# Patient Record
Sex: Male | Born: 1990 | Race: White | Hispanic: No | Marital: Single | Smoking: Never smoker
Health system: Southern US, Community
[De-identification: ages and names within clinical notes are randomized; demographics above are authoritative.]

## PROBLEM LIST (undated history)

## (undated) DIAGNOSIS — G809 Cerebral palsy, unspecified: Secondary | ICD-10-CM

## (undated) DIAGNOSIS — M3119 Other thrombotic microangiopathy: Secondary | ICD-10-CM

## (undated) DIAGNOSIS — Z8669 Personal history of other diseases of the nervous system and sense organs: Secondary | ICD-10-CM

## (undated) DIAGNOSIS — R197 Diarrhea, unspecified: Secondary | ICD-10-CM

## (undated) DIAGNOSIS — M311 Thrombotic microangiopathy: Secondary | ICD-10-CM

## (undated) HISTORY — PX: NISSEN FUNDOPLICATION: SHX2091

## (undated) HISTORY — DX: Personal history of other diseases of the nervous system and sense organs: Z86.69

## (undated) HISTORY — DX: Thrombotic microangiopathy: M31.1

## (undated) HISTORY — DX: Other thrombotic microangiopathy: M31.19

## (undated) HISTORY — DX: Cerebral palsy, unspecified: G80.9

## (undated) HISTORY — DX: Diarrhea, unspecified: R19.7

---
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
## 2010-01-03 ENCOUNTER — Ambulatory Visit: Payer: Self-pay | Admitting: Pediatrics

## 2010-03-12 ENCOUNTER — Encounter: Payer: Self-pay | Admitting: Pediatrics

## 2010-04-03 ENCOUNTER — Encounter: Payer: Self-pay | Admitting: Pediatrics

## 2011-08-28 ENCOUNTER — Encounter: Payer: Self-pay | Admitting: Pediatrics

## 2011-08-28 ENCOUNTER — Ambulatory Visit (INDEPENDENT_AMBULATORY_CARE_PROVIDER_SITE_OTHER): Payer: Medicaid Other | Admitting: Pediatrics

## 2011-08-28 ENCOUNTER — Encounter: Payer: Self-pay | Admitting: *Deleted

## 2011-08-28 VITALS — BP 103/71 | HR 101 | Temp 97.0°F | Ht <= 58 in | Wt <= 1120 oz

## 2011-08-28 DIAGNOSIS — K5289 Other specified noninfective gastroenteritis and colitis: Secondary | ICD-10-CM

## 2011-08-28 DIAGNOSIS — K529 Noninfective gastroenteritis and colitis, unspecified: Secondary | ICD-10-CM

## 2011-08-28 DIAGNOSIS — R197 Diarrhea, unspecified: Secondary | ICD-10-CM

## 2011-08-28 NOTE — Patient Instructions (Signed)
Continue Pentasa 500 mg three times daily. Will refer to Adult GI at [facility] for further care.

## 2011-08-29 ENCOUNTER — Encounter: Payer: Self-pay | Admitting: Pediatrics

## 2011-08-29 DIAGNOSIS — K529 Noninfective gastroenteritis and colitis, unspecified: Secondary | ICD-10-CM | POA: Insufficient documentation

## 2011-08-29 MED ORDER — MESALAMINE ER 500 MG PO CPCR: 500.0000 mg | ORAL_CAPSULE | Freq: Three times a day (TID) | ORAL | Status: DC

## 2011-08-29 NOTE — Progress Notes (Signed)
Subjective:     Patient ID: [name], male   DOB: 1991/01/08, 21 y.o.   MRN: [id number] BP 103/71  Pulse 101  Temp(Src) 97 F (36.1 C) (Axillary)  Ht 4' 1.5" (1.257 m)  Wt 50 lb (22.68 kg)  BMI 14.35 kg/m2. HPI Almost 21 yo male with unspecified colitis last seen 2 years ago. Weight increased 4 pounds. Continues to do well on Pentasa 1.5 gram daily but breakthrough episode every 10 days with watery BM but no blood. Also getting Omeprazole 20 mg QAM and 1 tablespoon fiber daily. 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 No fever, vomiting, abdomional distention, rashes, dysuria, arthralgia, etc.  Review of Systems  Constitutional: Negative.  Negative for fever, activity change, appetite change and unexpected weight change.  HENT: Negative.   Eyes: Negative.  Negative for visual disturbance.  Respiratory: Negative.  Negative for cough and wheezing.   Cardiovascular: Negative.  Negative for chest pain.  Gastrointestinal: Positive for diarrhea. Negative for nausea, vomiting, abdominal pain, constipation, blood in stool, abdominal distention and rectal pain.  Genitourinary: Negative.  Negative for dysuria and hematuria.  Musculoskeletal: Negative.  Negative for arthralgias.  Skin: Negative.  Negative for rash.  Neurological: Negative.  Negative for headaches.  Hematological: Negative.   Psychiatric/Behavioral: Negative.        Objective:   Physical Exam  Nursing note and vitals reviewed. Constitutional: He appears well-developed and well-nourished. No distress.  HENT:  Head: Normocephalic and atraumatic.  Eyes: Conjunctivae are normal.  Neck: Normal range of motion. No thyromegaly present.  Cardiovascular: Normal rate, regular rhythm and normal heart sounds.   No murmur heard. Pulmonary/Chest: Effort normal and breath sounds normal. He has no wheezes.  Abdominal: Soft. Bowel sounds are normal. He exhibits no distension and no mass. There is no tenderness.    Musculoskeletal: He exhibits no edema.  Lymphadenopathy:    He has no cervical adenopathy.  Skin: Skin is warm and dry. No rash noted.       Assessment:   Unspecified colitis-continued good response to mesalamine but may need to increase dose    Plan:   Discussed transferring care to adult GI due to advanced age (recommended [facility])  Told parents that [name]'s colitis remains poorly defined but is obviously a chronic process and will      require academic adult GI followup in case other problems arise  RTC prn

## 2011-08-29 NOTE — Progress Notes (Signed)
Addended by: Jon Gills on: 08/29/2011 04:05 PM   Modules accepted: Orders

## 2011-12-24 DIAGNOSIS — D693 Immune thrombocytopenic purpura: Secondary | ICD-10-CM | POA: Insufficient documentation

## 2011-12-24 DIAGNOSIS — R569 Unspecified convulsions: Secondary | ICD-10-CM | POA: Insufficient documentation

## 2011-12-24 DIAGNOSIS — G809 Cerebral palsy, unspecified: Secondary | ICD-10-CM | POA: Insufficient documentation

## 2012-12-03 ENCOUNTER — Other Ambulatory Visit: Payer: Self-pay | Admitting: Pediatrics

## 2012-12-03 DIAGNOSIS — R197 Diarrhea, unspecified: Secondary | ICD-10-CM

## 2012-12-03 DIAGNOSIS — K529 Noninfective gastroenteritis and colitis, unspecified: Secondary | ICD-10-CM

## 2012-12-03 MED ORDER — MESALAMINE ER 500 MG PO CPCR
500.0000 mg | ORAL_CAPSULE | Freq: Three times a day (TID) | ORAL | Status: DC
Start: 2012-12-03 — End: 2020-08-17

## 2013-08-07 ENCOUNTER — Other Ambulatory Visit: Payer: Self-pay | Admitting: Pediatrics

## 2013-08-10 ENCOUNTER — Other Ambulatory Visit: Payer: Self-pay | Admitting: Pediatrics

## 2013-08-14 ENCOUNTER — Other Ambulatory Visit: Payer: Self-pay | Admitting: Pediatrics

## 2013-09-04 ENCOUNTER — Other Ambulatory Visit: Payer: Self-pay | Admitting: Pediatrics

## 2013-12-01 ENCOUNTER — Encounter: Payer: Self-pay | Admitting: Pediatrics

## 2014-01-01 ENCOUNTER — Encounter: Payer: Self-pay | Admitting: Pediatrics

## 2015-09-20 ENCOUNTER — Ambulatory Visit: Payer: Self-pay | Admitting: Family Medicine

## 2017-11-05 ENCOUNTER — Ambulatory Visit: Payer: PRIVATE HEALTH INSURANCE

## 2017-11-05 ENCOUNTER — Other Ambulatory Visit: Payer: Self-pay | Admitting: Pediatrics

## 2017-11-05 DIAGNOSIS — R319 Hematuria, unspecified: Secondary | ICD-10-CM

## 2017-11-07 DIAGNOSIS — J45909 Unspecified asthma, uncomplicated: Secondary | ICD-10-CM | POA: Insufficient documentation

## 2017-11-07 DIAGNOSIS — E119 Type 2 diabetes mellitus without complications: Secondary | ICD-10-CM | POA: Insufficient documentation

## 2017-11-08 DIAGNOSIS — N200 Calculus of kidney: Secondary | ICD-10-CM | POA: Insufficient documentation

## 2017-11-11 DIAGNOSIS — H548 Legal blindness, as defined in USA: Secondary | ICD-10-CM | POA: Insufficient documentation

## 2017-11-15 DIAGNOSIS — Z833 Family history of diabetes mellitus: Secondary | ICD-10-CM | POA: Insufficient documentation

## 2017-11-15 DIAGNOSIS — R636 Underweight: Secondary | ICD-10-CM | POA: Insufficient documentation

## 2017-12-31 DIAGNOSIS — R768 Other specified abnormal immunological findings in serum: Secondary | ICD-10-CM | POA: Insufficient documentation

## 2020-05-29 DIAGNOSIS — K900 Celiac disease: Secondary | ICD-10-CM | POA: Insufficient documentation

## 2020-07-19 DIAGNOSIS — G0481 Other encephalitis and encephalomyelitis: Secondary | ICD-10-CM | POA: Insufficient documentation

## 2020-08-12 DIAGNOSIS — J690 Pneumonitis due to inhalation of food and vomit: Secondary | ICD-10-CM | POA: Insufficient documentation

## 2020-08-15 DIAGNOSIS — E43 Unspecified severe protein-calorie malnutrition: Secondary | ICD-10-CM | POA: Insufficient documentation

## 2020-08-16 ENCOUNTER — Telehealth: Payer: Self-pay | Admitting: Primary Care

## 2020-08-16 NOTE — Telephone Encounter (Signed)
Spoke with parents regarding Palliative referral/services and they are requesting a visit as soon as possible.  Patient was discharged home with feeding tube and a stage 2 sacral ulcer.  Patient was discharged from [facility] over the weekend and the discharge planner was unable to find a home health company to see patient.  I told parents that I would reach out to NP to see if she could possibly see patient before the weekend and that I would get back with them

## 2020-08-17 ENCOUNTER — Other Ambulatory Visit: Payer: Self-pay

## 2020-08-17 ENCOUNTER — Telehealth: Payer: Self-pay | Admitting: Primary Care

## 2020-08-17 ENCOUNTER — Other Ambulatory Visit: Payer: PRIVATE HEALTH INSURANCE | Admitting: Primary Care

## 2020-08-17 DIAGNOSIS — E119 Type 2 diabetes mellitus without complications: Secondary | ICD-10-CM

## 2020-08-17 DIAGNOSIS — J690 Pneumonitis due to inhalation of food and vomit: Secondary | ICD-10-CM

## 2020-08-17 DIAGNOSIS — Z515 Encounter for palliative care: Secondary | ICD-10-CM

## 2020-08-17 DIAGNOSIS — L89150 Pressure ulcer of sacral region, unstageable: Secondary | ICD-10-CM | POA: Insufficient documentation

## 2020-08-17 DIAGNOSIS — R636 Underweight: Secondary | ICD-10-CM

## 2020-08-17 DIAGNOSIS — H4720 Unspecified optic atrophy: Secondary | ICD-10-CM | POA: Insufficient documentation

## 2020-08-17 DIAGNOSIS — Q120 Congenital cataract: Secondary | ICD-10-CM | POA: Insufficient documentation

## 2020-08-17 DIAGNOSIS — E43 Unspecified severe protein-calorie malnutrition: Secondary | ICD-10-CM

## 2020-08-17 NOTE — Telephone Encounter (Signed)
Spoke with patient's father to let him know that the Palliative NP could see patient on 08/17/20 @ 10:30 AM and he was in agreement with this.

## 2020-08-17 NOTE — Progress Notes (Addendum)
[facility] Consult Note Telephone: [phone]  Fax: [phone]    Date of encounter: 08/17/20 PATIENT NAME: [name] [address] [place] [address] [phone] (home)  DOB: Mar 07, 1991 MRN: [id number]  PRIMARY CARE PROVIDER:    Derinda Late, MD [address] and Internal Medicine [place] [address] [phone]  REFERRING PROVIDER:   Derinda Late, MD [phone] [address] [facility] and Internal Medicine [place] [address] [phone]  RESPONSIBLE PARTY:   Extended Emergency Contact Information Primary Emergency Contact: [facility] Address: [address]          [place], [place] [address] Johnnette Litter of [place] Phone: [phone] Relation: Father Secondary Emergency Contact: Corinna Capra Address: [address]          [place], [place] [address] Home Phone: [phone] Work Phone: [phone] Relation: None  I met face to face with patient and family in home. Pallitive Care was asked to follow this patient by consultation request of Derinda Late, MD  to help address advance care planning and goals of care. This is the initial visit.   ASSESSMENT AND RECOMMENDATIONS:   1. Advance Care Planning/Goals of Care: Goals include to maximize quality of life and symptom management. Our advance care planning conversation included a discussion about:     The value and importance of advance care planning   Experiences with loved ones who have been seriously ill or have died   Exploration of personal, cultural or spiritual beliefs that might influence medical decisions   Exploration of goals of care in the event of a sudden injury or illness   Identification  of a healthcare agent   Review  of an  advance directive document    I met with parents  and patient in his home. They gave me his history, recent and remote. We  reviewed current medical problems regarding his nutrition, tolerance of tube feedings which is poor, and skin integrity. I asked them if they had talked about their advance plans and they had heard of it but had never discussed. We discussed their son's birth and disabilities and how they have spent their lives caring for him. He's always been able to come back from illnesses but he's now 48 pounds and is not tolerating the tube feedings.   His mother states she feels he is suffering and they don't want that for him but they also want to do everything they can. We discussed curative versus futile care, when it is time to stop treatments and embrace comfort care. Mother was tearful that he was suffering now trying to please them. She did not want this for him. She and her husband will discuss their goals and discuss with his physicians this week. I encouraged them to ask for a candid description of what the goals for any further interventions were and If this was in keeping with their intended goals of care. I outlined the hospice program with patient's mother I.e. services support philosophy and care management. I will f/u with more discussions,  and left some literature about difficult decisions, and the MOST form for review.   I spent 40 minutes providing this consultation,  from 1100 to 1140. More than 50% of the time in this consultation was spent in counseling and care coordination. -------------------------------------------------------------------------------------------------------  2. Symptom Management:   Patient met today with debilitating chronic disease onset in past year. Has CP at baseline  and has seizures from childhood to age 29 until age 12.  Has focal seizures. 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 Glucose was dropping to < 70 in am.  Now is in 400's. Patient has had increasing debility and not tolerateing tube feedings  Has had periods of weakness and somnolence,   2 weeks ago. He went to ED for this. Has mass on L upper lobe, and 7 cm abscess. From pneumonia. Went on iv abx for that.  Constipation: Chronic constipation, so significant the goals is several loose stools a day. This often soils the sacral dressing.   Pressure ulcer: wt is 48 lbs, baseline 70 lbs in 2019, since 2019 has been 60 lbs. Height 54". Has very bony prominent coccyx. I have sent in santyl for daily use and instructed in use. They have ordered foam dressings.  Nutrition: Has tube feedings now, per pump. Cannot eat po now without aspirating. Has been as low as 49 lbs. Had NG tube initially but now has peg.  He does not appear to be tolerating the tube feedings, generating a lot of secretions and coughing.  Secretions have some pink tinge. Mother endorses some pink colored medications but he may also have some erosion. I discussed his intolerance of tube feeds with his mother. I recommend reduction of volume and f/u for intolerance of feeds.  Secretions: suction per [place]. Has had aspiration so now and developed pneumonia. This is chronic risk due to choking/gagging/coughing. They are having to suction him 6 times / hr and he is uncomfortable with secretions most of the time. Recommend not cutting scopolamine patch but using whole or po anticholinergic. However I feel this is mostly from poorly tolerated tube feeds.  3. Follow up Palliative Care Visit: Palliative care will continue to follow for goals of care clarification and symptom management. Gave materials. Return 1-2 weeks or prn.   4. Family /Caregiver/Community Supports: Lives with parents who are caregivers. He is not able to make decisions. Completely dependent.  5. Cognitive / Functional decline: A and O x 1, non verbal. Dependent in all alds, iadls.  CODE STATUS: FULL  PPS: 30% (weak)  HOSPICE ELIGIBILITY/DIAGNOSIS: yes with concordant goals of care. Protein calorie malnutrition, gut malabsorption.  Subjective:  CHIEF  COMPLAINT: gut malabsorption, debility, FTT  HISTORY OF PRESENT ILLNESS:  [name] is a 30 y.o. year old male  with extensive history of CP, auto immune disorders. Today presents with Failure to thrive. Has now been Rx for 4-6 weeks for decline, not able to eat, tube feedings poorly tolerated with increased secretions. Has had x ray confirmed L lobe pneumonia.  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 He has pain and discomfort on moving, sacral decubitus with 100 %  Slough. He appears painful and PAINAD is 9/10.    We are asked to consult around advance care planning and complex medical decision making.    Review and summarization of old Epic records shows or history from other than patient.  Review or lab tests, radiology,  or medicine. Labs, x rays of L lung Review of case with family member. parents Decision for obtaining previous records [facility]   History obtained from review of EMR, discussion with primary team, and  interview with family, caregiver  and/or Mr. Selmer. Records reviewed and summarized above.   CURRENT PROBLEM LIST:  Patient Active Problem List   Diagnosis Date Noted  . 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 Severe protein-calorie malnutrition ([place]) 08/15/2020  . Aspiration pneumonia ([place]) 08/12/2020  . 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 Celiac disease 05/29/2020  . 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 Family history of type 2 diabetes mellitus in mother 11/15/2017  . Underweight 11/15/2017  . Legally blind 11/11/2017  . Nephrolithiasis 11/08/2017  . Asthma 11/07/2017  . Diabetes mellitus, new onset ([place]) 11/07/2017  . Cerebral palsy ([place]) 12/24/2011  . 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 Colitis 08/29/2011  . Diarrhea     PAST MEDICAL HISTORY:  Active Ambulatory Problems    Diagnosis Date Noted  . Diarrhea   . Colitis 08/29/2011  . Aspiration pneumonia ([place])  08/12/2020  . Asthma 11/07/2017  . 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 Celiac disease 05/29/2020  . Cerebral palsy ([place]) 12/24/2011  . 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 Diabetes mellitus, new onset ([place]) 11/07/2017  . Family history of type 2 diabetes mellitus in mother 11/15/2017  . 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 Legally blind 11/11/2017  . Nephrolithiasis 11/08/2017  . 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 Severe protein-calorie malnutrition ([place]) 08/15/2020  . Underweight 11/15/2017   Resolved Ambulatory Problems    Diagnosis Date Noted  . No Resolved Ambulatory Problems   Past Medical History:  Diagnosis Date  . History of seizure disorder   . TTP (thrombotic thrombocytopenic purpura)     SOCIAL HX:  Social History   Tobacco Use  . Smoking status: Never Smoker  . Smokeless tobacco: Never Used  Substance Use Topics  . Alcohol use: Not on file   FAMILY HX:  Family History  Problem Relation Age of Onset  . Migraines Brother       ALLERGIES:  Allergies  Allergen Reactions  . Phenytoin Sodium Extended Rash and Shortness Of Breath  . Wound Dressing Adhesive   . Albuterol Other (See Comments)    Increased heart rate  . Morphine And Related   . Phenobarbital   . Phenytoin Sodium Extended   . Sulfa Antibiotics   . Amoxicillin-Pot Clavulanate Diarrhea and Rash  . Bacitracin-Polymyxin B Rash  . Morphine Other (See Comments) and Rash      PERTINENT MEDICATIONS: Scheduled Meds: Continuous Infusions: PRN Meds:.   Objective: ROS/ parent report   General: NAD EYES: legally blind ENMT: ++dysphagia Pulmonary: ++  cough, denies increased SOB Abdomen: endorses poor appetite, NPO, endorses constipation, endorses incontinence of bowel GU: denies dysuria, endorses incontinence of urine MSK:  endorses ROM limitations, no falls reported Skin: endorses sacral wound Neurological: endorses  weakness, endorses pain, endorses insomnia Psych: Endorses withdrawn mood Heme/lymph/immuno: denies bruises, abnormal bleeding  Physical Exam: Current and past weights: reported 48 lbs (height 54"), BMI is 11.6 Constitutional: 160/96, hr 127, rr 24 po2 93% General: frail appearing, cachectic, painful EYES: anicteric sclera, lids intact, no discharge  ENMT: intact hearing,oral mucous membranes moist, dentition intact CV: S1S2, RRR, no LE edema Pulmonary:CLear on R, decreased air movement In L base, pt vocalizing somewhat, slight increased work of breathing, + cough, room air Abdomen: intake  Per PEG, having to decrease due to poor tolerance, normo-active BS +  4 quadrants, soft and non tender, no ascites GU: deferred MSK: severe sarcopenia, h/o CP, contractures of LE,UE, non ambulatory Skin: unstageable sacral PI, 5 cm x 3 cm with 100% slough in base. Neuro: extreme generalized weakness, severe cognitive impairment Psych: anxious affect, A and O x  1 Hem/lymph/immuno: no widespread bruising   Thank you for the opportunity to participate in the care of Mr. Codner.  The palliative care team will continue to follow. Please call our office at [phone] if we can be of additional assistance.  Jason Coop, NP , DNP, MPH, AGPCNP-BC, [facility]   COVID-19 PATIENT SCREENING TOOL  Person answering questions: _______Kathry Morefield____________   1.  Is the patient or any family member in the home showing any signs or symptoms regarding respiratory infection?                  Person with Symptom  ______________na___________ a. Fever/chills/headache                                                        Yes___ No__X_            b. Shortness of breath                                                            Yes___ No__X_           c. Cough/congestion                                               Yes___  No__X_          d. Muscle/Body aches/pains                                                    Yes___ No__X_         e. Gastrointestinal symptoms (diarrhea,nausea)             Yes___ No__X_         f. Sudden loss of smell or taste      Yes___ No__X_        2. Within the past 10 days, has anyone living in the home had any contact with someone with or under investigation for COVID-19?    Yes___ No__X__   Person __________________

## 2020-08-28 ENCOUNTER — Other Ambulatory Visit: Payer: Self-pay

## 2020-08-28 ENCOUNTER — Other Ambulatory Visit: Payer: No Typology Code available for payment source | Admitting: Primary Care

## 2020-08-28 DIAGNOSIS — E43 Unspecified severe protein-calorie malnutrition: Secondary | ICD-10-CM

## 2020-08-28 DIAGNOSIS — J690 Pneumonitis due to inhalation of food and vomit: Secondary | ICD-10-CM

## 2020-08-28 DIAGNOSIS — Z515 Encounter for palliative care: Secondary | ICD-10-CM

## 2020-08-28 DIAGNOSIS — K900 Celiac disease: Secondary | ICD-10-CM

## 2020-08-28 DIAGNOSIS — R768 Other specified abnormal immunological findings in serum: Secondary | ICD-10-CM

## 2020-08-28 DIAGNOSIS — G808 Other cerebral palsy: Secondary | ICD-10-CM

## 2020-08-28 DIAGNOSIS — E119 Type 2 diabetes mellitus without complications: Secondary | ICD-10-CM

## 2020-08-28 DIAGNOSIS — L89150 Pressure ulcer of sacral region, unstageable: Secondary | ICD-10-CM

## 2020-08-28 DIAGNOSIS — R569 Unspecified convulsions: Secondary | ICD-10-CM

## 2020-08-28 NOTE — Progress Notes (Signed)
[facility] Consult Note Telephone: [phone]  Fax: [phone]    Date of encounter: 08/28/20 PATIENT NAME: [name] [address] [place] [address]   [phone] (home)  DOB: [birth year] MRN: [id number] PRIMARY CARE PROVIDER:    Derinda Late, MD,  [facility] [place] [address] [phone]  REFERRING PROVIDER:   Derinda Late, MD,  [address] [facility] [place]  [phone]  RESPONSIBLE PARTY:    Contact Information    Name Relation Home Work Mobile   [name] Father [phone]     [name]  [phone]  [phone]   [name]  [phone]  [phone]      I met face to face with patient and family in  home. Palliative Care was asked to follow this patient by consultation request of  Joshua Late, MD  to address advance care planning and complex medical decision making. This is the follow up visit.   ASSESSMENT AND RECOMMENDATIONS:   1. Advance Care Planning/Goals of Care: Goals include to maximize quality of life and symptom management. Our advance care planning conversation included a discussion about:     The value and importance of advance care planning   Exploration of personal, cultural or spiritual beliefs that might influence medical decisions   Exploration of goals of care in the event of a sudden injury or illness   Identification of a healthcare agent - Discussed necessity of them getting guardianship  Creation of an  advance directive document .  Decision discussed citing past CPR done when patient was 4. Family states they want full scope of interventions. MOST uploaded.   I spent 16 minutes providing this consultation,  from 1300 to 1316.  More than 50% of the time in this consultation was spent in counseling and care  coordination. ------------------------------------------------------------------------------------------------------------------  2. Symptom Management:   Glucose management: Had mealtime short acting, but has leveled out to be managed with Lantus only. Glucose was spiking to 400's 10 days ago and now is WNL. Parents manage with libra output to smart device.  Dyspnea: Has improved and resolved. Had been coughing and spitting up feeding 10 days ago. Now no effort with breathing, no coughing.  Pressure Ulcer: Area with slough 100% still,  using santyl x 10 days. No real improvement in slough; still copious. Needs to have wound management involved to assess for osteomyelitis and to surgically debrid. Bone may be exposed.  Have made referral to Wound Clinic at [facility] for assessment; pt mother will call for appt. Instructed to continue with daily santyl for now and make initial appt ASAP with wound clinic.  Aspiration: ON 3/11 he aspirated thin and thick liquids on a swallow study. On my last visit he clinically could not handle oral secretions and needed 6-8 suctions/ hour. Today he has no issues with oral secretions, and no coughing. Will have a f/u swallow study.  Nutrition: Has been consulted with nutrition.  Taking 60-70 gm protein daily by tube feeds. Eating po eggs, other soft foods. Has g tube but have gone to bolus ( vs continual) and he is tolerating much better.   3. Follow up Palliative Care Visit: Palliative care will continue to follow for goals of care clarification and symptom management. Return 2 weeks or prn.  4. Family /Caregiver/Community Supports: parents are care givers,  Feeding supplies by [employer].  5. Cognitive / Functional decline: A and o x 1, sitting up, able to play. Dependent in all adls, iadls.  This visit was coded based on medical decision making (MDM).  CODE STATUS: FULL CODE  PPS: 40%  HOSPICE ELIGIBILITY/DIAGNOSIS: with concordant goals of  care  CHIEF COMPLAINT: sacral wound, protein calorie malnutrition  HISTORY OF PRESENT ILLNESS:  [name] is a 30 y.o. year old male  with CP, seizure disorder, protein calorie malnutrition, sacral wound, unstageable,  Type 1 diabetes, autoimmune encephalitis.  Has had pneumonia which clinically has resolved well, able to now tolerate tube feedings, taking 70 gm protein daily. Has gained 1-2 lbs in the past week. Pressure injury on sacrum is 4 cm x 2 cm, with slough in wound bed in context of immobility and malnutrition. This has persisted x 1 year. Has had a bit of breakdown of slough with enzymatic debrider but needs more aggressive debridement. Area is tender on manipulation. Needs wound clinic referral for management.  History obtained from review of EMR, discussion with primary team, and  interview with family, caregiver  and/or [name] Records reviewed and summarized above.  Review lab tests Lab albumin. 2.7 3 weeks ago, now 3.4. Review radiology results Report on 3/11 for swallow study.   CURRENT PROBLEM LIST:  Patient Active Problem List   Diagnosis Date Noted  . 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 Pressure injury of coccygeal region, unstageable ([place]) 08/17/2020  . Severe protein-calorie malnutrition ([place]) 08/15/2020  . Aspiration pneumonia ([place]) 08/12/2020  . 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 Celiac disease 05/29/2020  . 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 Family history of type 2 diabetes mellitus in mother 11/15/2017  . Underweight 11/15/2017  . Legally blind 11/11/2017  . Nephrolithiasis 11/08/2017  . Asthma 11/07/2017  . Diabetes mellitus, new onset ([place]) 11/07/2017  . Cerebral palsy ([place]) 12/24/2011  . 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 Seizures ([place]) 12/24/2011  . Colitis 08/29/2011  . Diarrhea     PAST MEDICAL HISTORY:  Active Ambulatory Problems    Diagnosis Date Noted  .  Diarrhea   . Colitis 08/29/2011  . Aspiration pneumonia ([place]) 08/12/2020  . Asthma 11/07/2017  . 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 Celiac disease 05/29/2020  . Cerebral palsy ([place]) 12/24/2011  . 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 Diabetes mellitus, new onset ([place]) 11/07/2017  . Family history of type 2 diabetes mellitus in mother 11/15/2017  . 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 Legally blind 11/11/2017  . Nephrolithiasis 11/08/2017  . 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 Severe protein-calorie malnutrition ([place]) 08/15/2020  . Underweight 11/15/2017   Resolved Ambulatory Problems    Diagnosis Date Noted  . No Resolved Ambulatory Problems   Past Medical History:  Diagnosis Date  . History of seizure disorder   . TTP (thrombotic thrombocytopenic purpura)    SOCIAL HX:  Social History   Tobacco Use  . Smoking status: Never Smoker  . Smokeless tobacco: Never Used  Substance Use Topics  . Alcohol use: Not on file   FAMILY HX:  Family History  Problem Relation Age of Onset  . Migraines Brother       ALLERGIES:  Allergies  Allergen Reactions  . Phenytoin Sodium Extended Rash and Shortness Of Breath  . Wound Dressing Adhesive   . Albuterol Other (See Comments)    Increased heart rate  . Morphine  And Related   . Phenobarbital   . Phenytoin Sodium Extended   . Sulfa Antibiotics   . Amoxicillin-Pot Clavulanate Diarrhea and Rash  . Bacitracin-Polymyxin B Rash  . Morphine Other (See Comments) and Rash     PERTINENT MEDICATIONS:  Outpatient Encounter Medications as of 08/28/2020  Medication Sig  . baclofen (LIORESAL) 10 MG tablet Take 5 mg by mouth every morning.  . baclofen (LIORESAL) 10 MG tablet Take 10 mg by mouth at bedtime.  . bisacodyl (DULCOLAX) 5 MG EC tablet Take 5 mg by mouth daily.  . budesonide (PULMICORT) 0.5 MG/2ML nebulizer solution Inhale 2 mLs into the lungs daily.  . cefdinir  (OMNICEF) 300 MG capsule Take 300 mg by mouth 2 (two) times daily.  . cloBAZam (ONFI) 10 MG tablet Take by mouth 2 (two) times daily.  Mariane Baumgarten Sodium 150 MG/15ML syrup Take 10 mLs by mouth daily.  . fluticasone (FLONASE) 50 MCG/ACT nasal spray Place 2 sprays into the nose daily.  . fluticasone (FLOVENT HFA) 44 MCG/ACT inhaler Inhale into the lungs 2 (two) times daily.  Marland Kitchen lamoTRIgine (LAMICTAL) 200 MG tablet Place 200 mg into feeding tube 2 (two) times daily.  Marland Kitchen levalbuterol (XOPENEX) 0.63 MG/3ML nebulizer solution Inhale 3 mLs into the lungs every 6 (six) hours as needed for dyspnea.  Marland Kitchen linaclotide (LINZESS) 72 MCG capsule Take 144 mcg by mouth daily.  . mesalamine (PENTASA) 250 MG CR capsule Take 500 mg by mouth in the morning and at bedtime.  . pantoprazole sodium (PROTONIX) 40 mg/20 mL PACK Give 20 mg by tube in the morning and at bedtime.  . polyethylene glycol powder (GLYCOLAX/MIRALAX) 17 GM/SCOOP powder Take 17 g by mouth daily.  . potassium chloride 20 MEQ/15ML (10%) SOLN Place 15 mLs into feeding tube in the morning and at bedtime.  . psyllium (METAMUCIL) 58.6 % packet Take 1 packet by mouth daily. (Patient not taking: Reported on 08/17/2020)  . scopolamine (TRANSDERM-SCOP) 1 MG/3DAYS Place 0.5 patches onto the skin every other day.  . senna (SENOKOT) 8.6 MG tablet Place 1 tablet into feeding tube at bedtime.   No facility-administered encounter medications on file as of 08/28/2020.     ROS per parents  General: NAD ENMT: denies  Overt dysphagia Cardiovascular: denies chest pain, denies DOE Pulmonary: denies cough, denies increased SOB Abdomen: endorses fair appetite, endorses constipation, endorses incontinence of bowel GU: denies dysuria, endorses incontinence of urine MSK:  + weakness,  no falls reported Skin:+ sacral wound Neurological: + occ general and sacral  pain, denies insomnia Psych: Endorses positive mood Heme/lymph/immuno: denies bruises, abnormal  bleeding  Physical Exam: Current and past weights: 49.2 lbs as of today. Baseline is 60 lbs.  Constitutional:  NAD General: frail appearing, cachectic EYES: anicteric sclera, lids intact, no discharge  ENMT: intact hearing, oral mucous membranes moist, dentition intact CV:  no LE edema Pulmonary: no increased work of breathing, no cough, room air Abdomen: intake 100% tube feeds, little po, no ascites GU: deferred MSK: severe  sarcopenia, moves all extremities,non  ambulatory Skin: warm and dry, sacral decubitus, non stageable, 4x2 cm Neuro:   generalized weakness,  Severe cognitive impairment Psych: non-anxious affect, A and O x 1 Hem/lymph/immuno: no widespread bruising   Thank you for the opportunity to participate in the care of [name]  The palliative care team will continue to follow. Please call our office at [phone] if we can be of additional assistance.   Jason Coop, NP , DNP, MPH,  AGPCNP-BC, ACHPN   COVID-19 PATIENT SCREENING TOOL Asked and negative response unless otherwise noted:   Have you had symptoms of covid, tested positive or been in contact with someone with symptoms/positive test in the past 5-10 days?

## 2020-08-30 ENCOUNTER — Other Ambulatory Visit: Payer: Self-pay

## 2020-08-30 ENCOUNTER — Other Ambulatory Visit
Admission: RE | Admit: 2020-08-30 | Discharge: 2020-08-30 | Disposition: A | Discharge status: Home / Self Care | Payer: Medicaid Other | Source: Ambulatory Visit | Attending: Internal Medicine | Admitting: Internal Medicine

## 2020-08-30 ENCOUNTER — Encounter: Payer: PRIVATE HEALTH INSURANCE | Attending: Internal Medicine | Admitting: Internal Medicine

## 2020-08-30 DIAGNOSIS — E43 Unspecified severe protein-calorie malnutrition: Secondary | ICD-10-CM | POA: Diagnosis not present

## 2020-08-30 DIAGNOSIS — Z885 Allergy status to narcotic agent status: Secondary | ICD-10-CM | POA: Diagnosis not present

## 2020-08-30 DIAGNOSIS — E1151 Type 2 diabetes mellitus with diabetic peripheral angiopathy without gangrene: Secondary | ICD-10-CM | POA: Insufficient documentation

## 2020-08-30 DIAGNOSIS — G809 Cerebral palsy, unspecified: Secondary | ICD-10-CM | POA: Diagnosis not present

## 2020-08-30 DIAGNOSIS — L89159 Pressure ulcer of sacral region, unspecified stage: Secondary | ICD-10-CM | POA: Diagnosis not present

## 2020-08-30 DIAGNOSIS — Z931 Gastrostomy status: Secondary | ICD-10-CM | POA: Insufficient documentation

## 2020-08-30 DIAGNOSIS — G0481 Other encephalitis and encephalomyelitis: Secondary | ICD-10-CM | POA: Diagnosis not present

## 2020-08-30 DIAGNOSIS — Z881 Allergy status to other antibiotic agents status: Secondary | ICD-10-CM | POA: Insufficient documentation

## 2020-08-30 DIAGNOSIS — Z882 Allergy status to sulfonamides status: Secondary | ICD-10-CM | POA: Insufficient documentation

## 2020-08-30 DIAGNOSIS — L089 Local infection of the skin and subcutaneous tissue, unspecified: Secondary | ICD-10-CM | POA: Diagnosis present

## 2020-08-30 DIAGNOSIS — L89154 Pressure ulcer of sacral region, stage 4: Secondary | ICD-10-CM | POA: Diagnosis present

## 2020-08-30 DIAGNOSIS — Z888 Allergy status to other drugs, medicaments and biological substances status: Secondary | ICD-10-CM | POA: Insufficient documentation

## 2020-08-30 DIAGNOSIS — Z880 Allergy status to penicillin: Secondary | ICD-10-CM | POA: Insufficient documentation

## 2020-09-01 ENCOUNTER — Other Ambulatory Visit: Payer: Self-pay | Admitting: Internal Medicine

## 2020-09-01 ENCOUNTER — Ambulatory Visit
Admission: RE | Admit: 2020-09-01 | Discharge: 2020-09-01 | Disposition: A | Discharge status: Home / Self Care | Payer: PRIVATE HEALTH INSURANCE | Attending: Internal Medicine | Admitting: Internal Medicine

## 2020-09-01 ENCOUNTER — Ambulatory Visit
Admission: RE | Admit: 2020-09-01 | Discharge: 2020-09-01 | Disposition: A | Discharge status: Home / Self Care | Payer: PRIVATE HEALTH INSURANCE | Source: Ambulatory Visit | Attending: Internal Medicine | Admitting: Internal Medicine

## 2020-09-01 DIAGNOSIS — L089 Local infection of the skin and subcutaneous tissue, unspecified: Secondary | ICD-10-CM | POA: Insufficient documentation

## 2020-09-01 DIAGNOSIS — T148XXA Other injury of unspecified body region, initial encounter: Secondary | ICD-10-CM | POA: Diagnosis present

## 2020-09-03 LAB — AEROBIC CULTURE W GRAM STAIN (SUPERFICIAL SPECIMEN)

## 2020-09-06 ENCOUNTER — Encounter: Payer: PRIVATE HEALTH INSURANCE | Attending: Internal Medicine | Admitting: Internal Medicine

## 2020-09-06 ENCOUNTER — Other Ambulatory Visit: Payer: Self-pay

## 2020-09-06 DIAGNOSIS — K5190 Ulcerative colitis, unspecified, without complications: Secondary | ICD-10-CM | POA: Insufficient documentation

## 2020-09-06 DIAGNOSIS — R569 Unspecified convulsions: Secondary | ICD-10-CM | POA: Diagnosis not present

## 2020-09-06 DIAGNOSIS — L89154 Pressure ulcer of sacral region, stage 4: Secondary | ICD-10-CM | POA: Diagnosis not present

## 2020-09-06 DIAGNOSIS — E11622 Type 2 diabetes mellitus with other skin ulcer: Secondary | ICD-10-CM | POA: Diagnosis present

## 2020-09-06 DIAGNOSIS — G809 Cerebral palsy, unspecified: Secondary | ICD-10-CM | POA: Insufficient documentation

## 2020-09-06 DIAGNOSIS — E43 Unspecified severe protein-calorie malnutrition: Secondary | ICD-10-CM | POA: Diagnosis not present

## 2020-09-07 NOTE — Progress Notes (Signed)
Joshua, [name] ([id number]) Visit Report for 09/06/2020 Debridement Details Patient Name: [name], [name] Date of Service: 09/06/2020 1:45 PM Medical Record Number: [id number] Patient Account Number: [id number] Date of Birth/Sex: 04/07/1991 (29 y.o. M) Treating RN: Cornell Barman Primary Care Provider: Derinda Late Other Clinician: Jeanine Luz Referring Provider: Derinda Late Treating Provider/Extender: Tito Dine in Treatment: 1 Debridement Performed for Wound #1 Sacrum Assessment: Performed By: Physician Ricard Dillon, MD Debridement Type: Debridement Level of Consciousness (Pre- Responds to Painful Stimuli procedure): Pre-procedure Verification/Time Out Yes - 14:36 Taken: Total Area Debrided (L x W): 0.6 (cm) x 0.6 (cm) = 0.36 (cm) Tissue and other material Viable, Non-Viable, Slough, Subcutaneous, Slough debrided: Level: Skin/Subcutaneous Tissue Debridement Description: Excisional Instrument: Curette Bleeding: Moderate Hemostasis Achieved: Pressure Response to Treatment: Procedure was tolerated well Level of Consciousness (Post- Awake and Alert procedure): Post Debridement Measurements of Total Wound Length: (cm) 0.6 Stage: Unstageable/Unclassified Width: (cm) 0.6 Depth: (cm) 0.7 Volume: (cm) 0.198 Character of Wound/Ulcer Post Debridement: Stable Post Procedure Diagnosis Same as Pre-procedure Electronic Signature(s) Signed: 09/06/2020 5:25:07 PM By: Gretta Cool, BSN, RN, CWS, Kim RN, BSN Signed: 09/07/2020 7:59:07 AM By: Linton Ham MD Entered By: Linton Ham on 09/06/2020 15:57:57 Transue, [name] ([id number]) -------------------------------------------------------------------------------- HPI Details Patient Name: [name] Date of Service: 09/06/2020 1:45 PM Medical Record Number: [id number] Patient Account Number: [id number] Date of Birth/Sex: May 01, 1991 (29 y.o. M) Treating RN: Cornell Barman Primary Care  Provider: Derinda Late Other Clinician: Jeanine Luz Referring Provider: Derinda Late Treating Provider/Extender: Tito Dine in Treatment: 1 History of Present Illness HPI Description: ADMISSION 08/30/2020 This is a difficult case of a unfortunate 30 year old man with cerebral palsy and seizures. 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 This caused quite a deterioration recently in his function he required placement of a PEG tube for feeding, he developed a significant left upper lobe aspiration driven lung met lung abscess for which she is taken Augmentin. He is nonambulatory. Nevertheless his parents were here today feel that he had ability to communicate and a good functional level up until very recently. He was hospitalized at the beginning of March for the pneumonia. During this time he was put in one of the hospital standard pressure prevention mattresses although this was apparently meant for an adult resulting in him sliding down and it and he developed a pressure sore on his lower sacrum. 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 They have been using Santyl. Apparently the nurse practitioner for palliative care who comes to see him in the home prescribed this Past medical history includes cerebral palsy with seizures, autoimmune encephalitis. He is a diabetic most recently listed in the [facility] records as type I based on low protein C levels and an anti-GAD antibody however his parents state that this has recently been called into question however I did not delve into this further. He apparently has some form of ulcerative colitis, history of candidemia,  nephrolithiasis, he is tube feed dependent although they still give him something orally. Left upper lobe lung abscess followed by ID at [facility] currently on Augmentin 4/6 culture I did last week showed staph lugdunensis.  X-ray did not show osteomyelitis was a poor quality film they recommended a CT scan. They have been using Santyl-based dressings. Also note that they changed the antibiotics for the lung abscess to Levaquin and Flagyl from Augmentin. The Levaquin will indeed cover the coag negative staph we cultured. He does have exposed bone however I do not feel pressured to do a CT scan at this point. It would be possible to biopsy the bone should that become necessary Electronic Signature(s) Signed: 09/07/2020 7:59:07 AM By: Linton Ham MD Entered By: Linton Ham on 09/06/2020 15:59:13 [name], [name] ([id number]) -------------------------------------------------------------------------------- Physical Exam Details Patient Name: [name] Date of Service: 09/06/2020 1:45 PM Medical Record Number: [id number] Patient Account Number: [id number] Date of Birth/Sex: [birth year] (29 y.o. M) Treating RN: Cornell Barman Primary Care Provider: Derinda Late Other Clinician: Jeanine Luz Referring Provider: Derinda Late Treating Provider/Extender: Tito Dine in Treatment: 1 Constitutional Sitting or standing Blood Pressure is within target range for patient.. Pulse regular and within target range for patient.[name] regular, non- labored and within target range.. Temperature is normal and within the target range for the patient.[name] appears in no distress. Notes Wound exam; penetrating lower sacrum/coccyx wound. There is exposed bone in the middle of this. A lot of liquefied necrotic debris which I removed with a #5 curette. Underneath this we have healthier looking granulation tissue surrounding the open area of exposed bone. Hemostasis with direct pressure  there was no evidence of surrounding infection Electronic Signature(s) Signed: 09/07/2020 7:59:07 AM By: Linton Ham MD Entered By: Linton Ham on 09/06/2020 16:00:18 [name], [name] ([id number]) -------------------------------------------------------------------------------- Physician Orders Details Patient Name: [name] Date of Service: 09/06/2020 1:45 PM Medical Record Number: [id number] Patient Account Number: [id number] Date of Birth/Sex: [birth year] (29 y.o. M) Treating RN: Cornell Barman Primary Care Provider: Derinda Late Other Clinician: Jeanine Luz Referring Provider: Derinda Late Treating Provider/Extender: Tito Dine in Treatment: 1 Verbal / Phone Orders: No Diagnosis Coding Follow-up Appointments Wound #1 Sacrum o Return Appointment in 1 week. Bathing/ Shower/ Hygiene Wound #1 Sacrum o Clean wound with Normal Saline or wound cleanser. Off-Loading o Turn and reposition every 2 hours o Other: - Keep all pressure of of wounded area Wound Treatment Wound #1 - Sacrum Cleanser: Normal Saline 1 x Per Day/30 Days Discharge Instructions: Wash your hands with soap and water. Remove old dressing, discard into plastic bag and place into trash. Cleanse the wound with Normal Saline prior to applying a clean dressing using gauze sponges, not tissues or cotton balls. Do not scrub or use excessive force. Pat dry using gauze sponges, not tissue or cotton balls. Topical: Santyl Collagenase Ointment, 30 (gm), tube 1 x Per Day/30 Days Discharge Instructions: apply nickel thick to wound bed only Secondary Dressing: Mepilex Border Flex, 4x4 (in/in) 1 x Per Day/30 Days Discharge Instructions: Apply to wound as directed. Do not cut. Electronic Signature(s) Signed: 09/06/2020 5:25:07 PM By: Gretta Cool, BSN, RN, CWS, Kim RN, BSN Signed: 09/07/2020 7:59:07 AM By: Linton Ham MD Entered By: Gretta Cool, BSN, RN, CWS, Kim on 09/06/2020 14:38:12 [name],  [name] ([id number]) -------------------------------------------------------------------------------- Problem List Details Patient Name: [name] Date of Service: 09/06/2020 1:45 PM Medical Record Number: [id number] Patient Account Number: [id number] Date of Birth/Sex: 1990/07/15 (29 y.o. M) Treating RN: Cornell Barman Primary Care Provider: Derinda Late Other Clinician: Jeanine Luz Referring Provider: Derinda Late Treating Provider/Extender: Tito Dine in Treatment: 1 Active Problems ICD-10 Encounter Code Description Active Date MDM  Diagnosis L89.154 Pressure ulcer of sacral region, stage 4 08/30/2020 No Yes E43 Unspecified severe protein-calorie malnutrition 08/30/2020 No Yes G80.9 Cerebral palsy, unspecified 08/30/2020 No Yes Inactive Problems Resolved Problems Electronic Signature(s) Signed: 09/07/2020 7:59:07 AM By: Linton Ham MD Entered By: Linton Ham on 09/06/2020 15:57:39 [name], [name] ([id number]) -------------------------------------------------------------------------------- Progress Note Details Patient Name: [name] Date of Service: 09/06/2020 1:45 PM Medical Record Number: [id number] Patient Account Number: [id number] Date of Birth/Sex: 01-09-1991 (29 y.o. M) Treating RN: Cornell Barman Primary Care Provider: Derinda Late Other Clinician: Jeanine Luz Referring Provider: Derinda Late Treating Provider/Extender: Tito Dine in Treatment: 1 Subjective History of Present Illness (HPI) ADMISSION 08/30/2020 This is a difficult case of a unfortunate 30 year old man with cerebral palsy and seizures. 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 This caused quite a deterioration recently in his function he required placement of a PEG tube for  feeding, he developed a significant left upper lobe aspiration driven lung met lung abscess for which she is taken Augmentin. He is nonambulatory. Nevertheless his parents were here today feel that he had ability to communicate and a good functional level up until very recently. He was hospitalized at the beginning of March for the pneumonia. During this time he was put in one of the hospital standard pressure prevention mattresses although this was apparently meant for an adult resulting in him sliding down and it and he developed a pressure sore on his lower sacrum. 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 They have been using Santyl. Apparently the nurse practitioner for palliative care who comes to see him in the home prescribed this Past medical history includes cerebral palsy with seizures, autoimmune encephalitis. He is a diabetic most recently listed in the [facility] records as type I based on low protein C levels and an anti-GAD antibody however his parents state that this has recently been called into question however I did not delve into this further. He apparently has some form of ulcerative colitis, history of candidemia, nephrolithiasis, he is tube feed dependent although they still give him something orally. Left upper lobe lung abscess followed by ID at [facility] currently on Augmentin 4/6 culture I did last week showed staph lugdunensis. X-ray did not show osteomyelitis was a poor quality film they recommended a CT scan. They have been using Santyl-based dressings. Also note that they changed the antibiotics for the lung abscess to Levaquin and Flagyl from Augmentin. The Levaquin will indeed cover the coag negative staph we cultured. He does have exposed bone however I do not feel pressured to do a CT scan at this point. It would be possible to biopsy  the bone should that become necessary Objective Constitutional Sitting or standing Blood Pressure is within target range for patient.. Pulse regular and within target range for patient.[name] regular, non- labored and within target range.. Temperature is normal and within the target range for the patient.[name] appears in no distress. Vitals Time Taken: 1:45 PM, Height: 54 in, Weight: 50 lbs, BMI: 12.1, Pulse: 95 bpm, Respiratory Rate: 16 breaths/min, Blood Pressure: 109/78 mmHg. General Notes: Wound exam; penetrating lower sacrum/coccyx wound. There is exposed  bone in the middle of this. A lot of liquefied necrotic debris which I removed with a #5 curette. Underneath this we have healthier looking granulation tissue surrounding the open area of exposed bone. Hemostasis with direct pressure there was no evidence of surrounding infection Integumentary (Hair, Skin) Wound #1 status is Open. Original cause of wound was Gradually Appeared. The date acquired was: 08/04/2020. The wound has been in treatment 1 weeks. The wound is located on the Sacrum. The wound measures 0.6cm length x 0.6cm width x 0.7cm depth; 0.283cm^2 area and 0.198cm^3 volume. There is Fat Layer (Subcutaneous Tissue) exposed. There is no tunneling or undermining noted. There is a medium amount of serous drainage noted. There is small (1-33%) pink granulation within the wound bed. There is a large (67-100%) amount of necrotic tissue within the wound bed including Adherent Slough. Assessment Active Problems ICD-10 [name], [name] ([id number]) Pressure ulcer of sacral region, stage 4 Unspecified severe protein-calorie malnutrition Cerebral palsy, unspecified Procedures Wound #1 Pre-procedure diagnosis of Wound #1 is a Pressure Ulcer located on the Sacrum . There was a Excisional Skin/Subcutaneous Tissue Debridement with a total area of 0.36 sq cm performed by Ricard Dillon, MD. With the following instrument(s): Curette  to remove Viable and Non- Viable tissue/material. Material removed includes Subcutaneous Tissue and Slough and. No specimens were taken. A time out was conducted at 14:36, prior to the start of the procedure. A Moderate amount of bleeding was controlled with Pressure. The procedure was tolerated well. Post Debridement Measurements: 0.6cm length x 0.6cm width x 0.7cm depth; 0.198cm^3 volume. Post debridement Stage noted as Unstageable/Unclassified. Character of Wound/Ulcer Post Debridement is stable. Post procedure Diagnosis Wound #1: Same as Pre-Procedure Plan Follow-up Appointments: Wound #1 Sacrum: Return Appointment in 1 week. Bathing/ Shower/ Hygiene: Wound #1 Sacrum: Clean wound with Normal Saline or wound cleanser. Off-Loading: Turn and reposition every 2 hours Other: - Keep all pressure of of wounded area WOUND #1: - Sacrum Wound Laterality: Cleanser: Normal Saline 1 x Per Day/30 Days Discharge Instructions: Wash your hands with soap and water. Remove old dressing, discard into plastic bag and place into trash. Cleanse the wound with Normal Saline prior to applying a clean dressing using gauze sponges, not tissues or cotton balls. Do not scrub or use excessive force. Pat dry using gauze sponges, not tissue or cotton balls. Topical: Santyl Collagenase Ointment, 30 (gm), tube 1 x Per Day/30 Days Discharge Instructions: apply nickel thick to wound bed only Secondary Dressing: Mepilex Border Flex, 4x4 (in/in) 1 x Per Day/30 Days Discharge Instructions: Apply to wound as directed. Do not cut. 1. I am going to continue with the Santyl for another week 2. Consider collagen-based dressings after that to see if we can stimulate some granulation 3. I had some thoughts about an advanced treatment option although he is a very odd insurance. We will have to see how that goes 4. The coag negative staph I cultured should be covered by the Levaquin previously and recently prescribed for his left  upper lobe lung abscess/pneumonia. Electronic Signature(s) Signed: 09/07/2020 7:59:07 AM By: Linton Ham MD Entered By: Linton Ham on 09/06/2020 16:01:03 [name], [name] ([id number]) -------------------------------------------------------------------------------- SuperBill Details Patient Name: [name] Date of Service: 09/06/2020 Medical Record Number: [id number] Patient Account Number: [id number] Date of Birth/Sex: [birth year] (29 y.o. M) Treating RN: Cornell Barman Primary Care Provider: Derinda Late Other Clinician: Jeanine Luz Referring Provider: Derinda Late Treating Provider/Extender: Tito Dine in Treatment: 1 Diagnosis Coding ICD-10  Codes Code Description L89.154 Pressure ulcer of sacral region, stage 4 E43 Unspecified severe protein-calorie malnutrition G80.9 Cerebral palsy, unspecified Facility Procedures CPT4 Code: [id number] Description: 45733 - DEB SUBQ TISSUE 20 SQ CM/< Modifier: Quantity: 1 CPT4 Code: Description: ICD-10 Diagnosis Description L89.154 Pressure ulcer of sacral region, stage 4 Modifier: Quantity: Physician Procedures CPT4 Code: [id number] Description: 11042 - WC PHYS SUBQ TISS 20 SQ CM Modifier: Quantity: 1 CPT4 Code: Description: ICD-10 Diagnosis Description L89.154 Pressure ulcer of sacral region, stage 4 Modifier: Quantity: Electronic Signature(s) Signed: 09/07/2020 7:59:07 AM By: Linton Ham MD Entered By: Linton Ham on 09/06/2020 16:01:24

## 2020-09-07 NOTE — Progress Notes (Signed)
[name], [name] ([id number]) Visit Report for 09/06/2020 Arrival Information Details Patient Name: [name], [name] Date of Service: 09/06/2020 1:45 PM Medical Record Number: [id number] Patient Account Number: [id number] Date of Birth/Sex: May 07, 1991 (29 y.o. M) Treating RN: [name] Primary Care [name]: [name] Other Clinician: Lolita [name] Referring Yevette Knust: [name] Treating [name]/Extender: [name] in Treatment: 1 Visit Information History Since Last Visit Added or deleted any medications: No Patient Arrived: Wheel Chair Had a fall or experienced change in No Arrival Time: 13:46 activities of daily living that may affect Accompanied By: parents risk of falls: Transfer Assistance: Manual Hospitalized since last visit: No Patient Identification Verified: Yes Pain Present Now: Unable to Respond Secondary Verification Process Completed: Yes Patient Requires Transmission-Based Precautions: No Patient Has Alerts: No Electronic Signature(s) Signed: 09/06/2020 4:53:25 PM By: [name] Entered By: [name] on 09/06/2020 13:46:51 Reckner, [name] ([id number]) -------------------------------------------------------------------------------- Encounter Discharge Information Details Patient Name: [name] Date of Service: 09/06/2020 1:45 PM Medical Record Number: [id number] Patient Account Number: [id number] Date of Birth/Sex: May 16, 1991 (29 y.o. M) Treating RN: [name] Primary Care [name]: [name] Other Clinician: Lolita [name] Referring [name]: [name] Treating [name]/Extender: [name] in Treatment: 1 Encounter Discharge Information Items Post Procedure Vitals Discharge Condition: Stable Pulse (bpm): 95 Ambulatory Status: Ambulatory Respiratory Rate (breaths/min): 14 Discharge Destination: Home Blood Pressure (mmHg): 109/78 Transportation: Private Auto Unable to  obtain vitals Reason: . Accompanied By: parents Schedule Follow-up Appointment: Yes Clinical Summary of Care: Electronic Signature(s) Signed: 09/06/2020 5:25:07 PM By: [name], BSN, RN, CWS, Kim RN, BSN Entered By: [name], BSN, RN, CWS, Kim on 09/06/2020 14:44:16 [name], [name] ([id number]) -------------------------------------------------------------------------------- Lower Extremity Assessment Details Patient Name: [name] Date of Service: 09/06/2020 1:45 PM Medical Record Number: [id number] Patient Account Number: [id number] Date of Birth/Sex: Oct 28, 1990 (29 y.o. M) Treating RN: [name] Primary Care Julane Crock: [name] Other Clinician: Lolita [name] Referring Camora Tremain: [name] Treating [name]/Extender: [name] in Treatment: 1 Electronic Signature(s) Signed: 09/06/2020 4:53:25 PM By: [name] Signed: 09/06/2020 5:25:07 PM By: [name] BSN, RN, CWS, Kim RN, BSN Entered By: [name] on 09/06/2020 13:48:35 [name], [name] ([id number]) -------------------------------------------------------------------------------- Multi Wound Chart Details Patient Name: [name] Date of Service: 09/06/2020 1:45 PM Medical Record Number: [id number] Patient Account Number: [id number] Date of Birth/Sex: Dec 20, 1990 (29 y.o. M) Treating RN: [name] Primary Care Ovie Eastep: [name] Other Clinician: Lolita [name] Referring Bexley Mclester: [name] Treating Kito Cuffe/Extender: Joshua [place] in Treatment: 1 Vital Signs Height(in): 54 Pulse(bpm): 95 Weight(lbs): 50 Blood Pressure(mmHg): 109/78 Body Mass Index(BMI): 12 Temperature(F): Respiratory Rate(breaths/min): 16 Photos: [N/A:N/A] Wound Location: Sacrum N/A N/A Wounding Event: Gradually Appeared N/A N/A Primary Etiology: Pressure Ulcer N/A N/A Comorbid History: Type II Diabetes N/A N/A Date Acquired: 08/04/2020 N/A N/A Weeks of Treatment: 1 N/A  N/A Wound Status: Open N/A N/A Measurements L x W x [name] (cm) 0.6x0.6x0.7 N/A N/A Area (cm) : 0.283 N/A N/A Volume (cm) : 0.198 N/A N/A % Reduction in Area: 87.10% N/A N/A % Reduction in Volume: 92.50% N/A N/A Classification: Category/Stage IV N/A N/A Exudate Amount: Medium N/A N/A Exudate Type: Serous N/A N/A Exudate Color: amber N/A N/A Granulation Amount: Small (1-33%) N/A N/A Granulation Quality: Pink N/A N/A Necrotic Amount: Large (67-100%) N/A N/A Exposed Structures: Fat Layer (Subcutaneous Tissue): N/A N/A Yes Fascia: No Tendon: No Muscle: No Joint: No Bone: No Epithelialization: None N/A N/A Debridement: Debridement - Excisional N/A N/A Pre-procedure Verification/Time  14:36 N/A N/A Out Taken: Tissue Debrided: Subcutaneous, Slough N/A N/A Level: Skin/Subcutaneous Tissue N/A N/A Debridement Area (sq cm): 0.36 N/A N/A Instrument: Curette N/A N/A Bleeding: Moderate N/A N/A Hemostasis Achieved: Pressure N/A N/A Debridement Treatment Procedure was tolerated well N/A N/A Response: Post Debridement 0.6x0.6x0.7 N/A N/A Measurements L x W x [name] (cm) Post Debridement Volume: 0.198 N/A N/A (cm) [name], [name] ([id number]) Post Debridement Stage: Unstageable/Unclassified N/A N/A Procedures Performed: Debridement N/A N/A Treatment Notes Wound #1 (Sacrum) Cleanser Normal Saline Discharge Instruction: Wash your hands with soap and water. Remove old dressing, discard into plastic bag and place into trash. Cleanse the wound with Normal Saline prior to applying a clean dressing using gauze sponges, not tissues or cotton balls. Do not scrub or use excessive force. Pat dry using gauze sponges, not tissue or cotton balls. Peri-Wound Care Topical Santyl Collagenase Ointment, 30 (gm), tube Discharge Instruction: apply nickel thick to wound bed only Primary Dressing Secondary Dressing Mepilex Border Flex, 4x4 (in/in) Discharge Instruction: Apply to wound as directed. Do not  cut. Secured With Compression Wrap Compression Stockings Facilities managerAdd-Ons Electronic Signature(s) Signed: 09/07/2020 7:59:07 AM By: Baltazar Najjarobson, Michael MD Entered By: Baltazar Najjarobson, Michael on 09/06/2020 15:57:47 Basley, Chi [name]. ([id number]) -------------------------------------------------------------------------------- Multi-Disciplinary Care Plan Details Patient Name: Joshua HoraMOREFIELD, [name] Date of Service: 09/06/2020 1:45 PM Medical Record Number: [id number] Patient Account Number: [id number] Date of Birth/Sex: 14-Sep-1990 (29 y.o. M) Treating RN: Joshua CoventryWoody, Kim Primary Care Daveah Varone: Joshua RudBabaoff, Marcus Other Clinician: Lolita CramBurnette, Kyara Referring Camora Tremain: Joshua RudBabaoff, Marcus Treating Arkie Tagliaferro/Extender: Joshua CarolinaOBSON, MICHAEL G Weeks in Treatment: 1 Active Inactive Necrotic Tissue Nursing Diagnoses: Impaired tissue integrity related to necrotic/devitalized tissue Goals: Necrotic/devitalized tissue will be minimized in the wound bed Date Initiated: 08/30/2020 Target Resolution Date: 09/06/2020 Goal Status: Active Interventions: Assess patient pain level pre-, during and post procedure and prior to discharge Treatment Activities: Enzymatic debridement : 08/30/2020 Notes: Orientation to the Wound Care Program Nursing Diagnoses: Knowledge deficit related to the wound healing center program Goals: Patient/caregiver will verbalize understanding of the Wound Healing Center Program Date Initiated: 08/30/2020 Target Resolution Date: 08/30/2020 Goal Status: Active Interventions: Provide education on orientation to the wound center Notes: Pressure Nursing Diagnoses: Knowledge deficit related to management of pressures ulcers Potential for impaired tissue integrity related to pressure, friction, moisture, and shear Goals: Patient will remain free from development of additional pressure ulcers Date Initiated: 08/30/2020 Target Resolution Date: 09/20/2020 Goal Status: Active Patient/caregiver will verbalize understanding  of pressure ulcer management Date Initiated: 08/30/2020 Target Resolution Date: 09/20/2020 Goal Status: Active Interventions: Provide education on pressure ulcers Notes: Wound/Skin Impairment Nursing Diagnoses: Joshua HoraMOREFIELD, [name] ([id number]) Knowledge deficit related to ulceration/compromised skin integrity Goals: Patient/caregiver will verbalize understanding of skin care regimen Date Initiated: 08/30/2020 Target Resolution Date: 09/06/2020 Goal Status: Active Ulcer/skin breakdown will have a volume reduction of 30% by week 4 Date Initiated: 08/30/2020 Target Resolution Date: 09/30/2020 Goal Status: Active Interventions: Assess ulceration(s) every visit Provide education on ulcer and skin care Treatment Activities: Referred to DME Wynonna Fitzhenry for dressing supplies : 08/30/2020 Skin care regimen initiated : 08/30/2020 Topical wound management initiated : 08/30/2020 Notes: Electronic Signature(s) Signed: 09/06/2020 5:25:07 PM By: Joshua GurneyWoody, BSN, RN, CWS, Kim RN, BSN Entered By: Joshua GurneyWoody, BSN, RN, CWS, Kim on 09/06/2020 14:30:10 Pappalardo, [name] ([id number]) -------------------------------------------------------------------------------- Pain Assessment Details Patient Name: Joshua HoraMOREFIELD, [name] Date of Service: 09/06/2020 1:45 PM Medical Record Number: [id number] Patient Account Number: [id number] Date of Birth/Sex: 14-Sep-1990 (29 y.o. M) Treating RN: Joshua CoventryWoody, Kim Primary Care Keelynn Furgerson: Joshua RudBabaoff, Marcus Other Clinician:  Lolita [name] Referring Karielle Davidow: [name] Treating Astria Jordahl/Extender: Joshua [place] in Treatment: 1 Active Problems Location of Pain Severity and Description of Pain Patient Has Paino Patient Unable to Respond Site Locations Pain Management and Medication Current Pain Management: Electronic Signature(s) Signed: 09/06/2020 4:53:25 PM By: [name] Signed: 09/06/2020 5:25:07 PM By: [name], BSN, RN, CWS, Kim RN, BSN Entered By: [name] on  09/06/2020 13:48:25 Hendrie, [name] ([id number]) -------------------------------------------------------------------------------- Patient/Caregiver Education Details Patient Name: [name] Date of Service: 09/06/2020 1:45 PM Medical Record Number: [id number] Patient Account Number: [id number] Date of Birth/Gender: March 06, 1991 (29 y.o. M) Treating RN: [name] Primary Care Physician: [name] Other Clinician: Lolita [name] Referring Physician: Kandyce [name] Treating Physician/Extender: Joshua [place] in Treatment: 1 Education Assessment Education Provided To: Patient Education Topics Provided Pressure: Handouts: Pressure Ulcers: Care and Offloading Methods: Demonstration, Explain/Verbal Responses: State content correctly Welcome To The [facility]: Wound/Skin Impairment: Handouts: Caring for Your Ulcer Methods: Demonstration, Explain/Verbal Responses: State content correctly Electronic Signature(s) Signed: 09/06/2020 5:25:07 PM By: [name], BSN, RN, CWS, Kim RN, BSN Entered By: [name], BSN, RN, CWS, Kim on 09/06/2020 14:38:46 Docken, [name] ([id number]) -------------------------------------------------------------------------------- Wound Assessment Details Patient Name: [name] Date of Service: 09/06/2020 1:45 PM Medical Record Number: [id number] Patient Account Number: [id number] Date of Birth/Sex: Mar 18, 1991 (29 y.o. M) Treating RN: [name] Primary Care Jahking Lesser: [name] Other Clinician: Lolita [name] Referring Okley Magnussen: [name] Treating Iram Lundberg/Extender: Joshua [place] in Treatment: 1 Wound Status Wound Number: 1 Primary Etiology: Pressure Ulcer Wound Location: Sacrum Wound Status: Open Wounding Event: Gradually Appeared Comorbid History: Type II Diabetes Date Acquired: 08/04/2020 Weeks Of Treatment: 1 Clustered Wound: No Photos Wound Measurements Length: (cm) 0.6 Width: (cm)  0.6 Depth: (cm) 0.7 Area: (cm) 0.283 Volume: (cm) 0.198 % Reduction in Area: 87.1% % Reduction in Volume: 92.5% Epithelialization: None Tunneling: No Undermining: No Wound Description Classification: Category/Stage IV Exudate Amount: Medium Exudate Type: Serous Exudate Color: amber Foul Odor After Cleansing: No Slough/Fibrino Yes Wound Bed Granulation Amount: Small (1-33%) Exposed Structure Granulation Quality: Pink Fascia Exposed: No Necrotic Amount: Large (67-100%) Fat Layer (Subcutaneous Tissue) Exposed: Yes Necrotic Quality: Adherent Slough Tendon Exposed: No Muscle Exposed: No Joint Exposed: No Bone Exposed: No Treatment Notes Wound #1 (Sacrum) Cleanser Normal Saline Discharge Instruction: Wash your hands with soap and water. Remove old dressing, discard into plastic bag and place into trash. Cleanse the wound with Normal Saline prior to applying a clean dressing using gauze sponges, not tissues or cotton balls. Do not scrub or use excessive force. Pat dry using gauze sponges, not tissue or cotton balls. Amadi, [name] ([id number]) Peri-Wound Care Topical Santyl Collagenase Ointment, 30 (gm), tube Discharge Instruction: apply nickel thick to wound bed only Primary Dressing Secondary Dressing Mepilex Border Flex, 4x4 (in/in) Discharge Instruction: Apply to wound as directed. Do not cut. Secured With Compression Wrap Compression Stockings Facilities manager) Signed: 09/06/2020 5:25:07 PM By: [name], BSN, RN, CWS, Kim RN, BSN Entered By: [name], BSN, RN, CWS, Kim on 09/06/2020 14:37:40 Elsey, Joshua DMarland [name] ([id number]) -------------------------------------------------------------------------------- Vitals Details Patient Name: [name] Date of Service: 09/06/2020 1:45 PM Medical Record Number: [id number] Patient Account Number: [id number] Date of Birth/Sex: February 19, 1991 (29 y.o. M) Treating RN: [name] Primary Care Bryton Romagnoli:  [name] Other Clinician: Lolita [name] Referring Adal Sereno: [name] Treating Suvan Stcyr/Extender: Joshua [place] in Treatment: 1 Vital Signs Time Taken: 13:45 Pulse (bpm): 95 Height (in): 54 Respiratory Rate (breaths/min): 16 Weight (  lbs): 50 Blood Pressure (mmHg): 109/78 Body Mass Index (BMI): 12.1 Reference Range: 80 - 120 mg / dl Electronic Signature(s) Signed: 09/06/2020 4:53:25 PM By: [name] Entered By: [name] on 09/06/2020 13:48:12

## 2020-09-08 NOTE — Progress Notes (Signed)
CUTHBERT, TURTON ([id number]) Visit Report for 08/30/2020 Allergy List Details Patient Name: Joshua, [name] Date of Service: 08/30/2020 9:30 AM Medical Record Number: [id number] Patient Account Number: [id number] Date of Birth/Sex: 10/08/1990 (29 y.o. M) Treating RN: Yevonne Pax Primary Care Dionicio Shelnutt: Kandyce Rud Other Clinician: Lolita Cram Referring Corena Tilson: Kandyce Rud Treating Macayla Ekdahl/Extender: Maxwell Caul Weeks in Treatment: 0 Allergies Active Allergies albuterol morphine phenobarbital phenytoin sodium Sulfa (Sulfonamide Antibiotics) amoxicillin bacitracin Allergy Notes Electronic Signature(s) Signed: 09/08/2020 8:11:40 AM By: Yevonne Pax RN Entered By: Yevonne Pax on 08/30/2020 10:28:04 [name], [name] ([id number]) -------------------------------------------------------------------------------- Arrival Information Details Patient Name: [name] Date of Service: 08/30/2020 9:30 AM Medical Record Number: [id number] Patient Account Number: [id number] Date of Birth/Sex: [birth year] (29 y.o. M) Treating RN: Yevonne Pax Primary Care Golden Gilreath: Kandyce Rud Other Clinician: Lolita Cram Referring Sheridan Hew: Kandyce Rud Treating Abdoulie Tierce/Extender: Altamese [place] in Treatment: 0 Visit Information Patient Arrived: Wheel Chair Arrival Time: 09:35 Accompanied By: parents Transfer Assistance: None Patient Identification Verified: Yes Secondary Verification Process Completed: Yes Patient Requires Transmission-Based Precautions: No Patient Has Alerts: No Electronic Signature(s) Signed: 09/08/2020 8:11:40 AM By: Yevonne Pax RN Entered By: Yevonne Pax on 08/30/2020 10:04:14 [name], [name] ([id number]) -------------------------------------------------------------------------------- Clinic Level of Care Assessment Details Patient Name: [name] Date of Service: 08/30/2020 9:30 AM Medical Record  Number: [id number] Patient Account Number: [id number] Date of Birth/Sex: [birth year] (29 y.o. M) Treating RN: Huel Coventry Primary Care Paraskevi Funez: Kandyce Rud Other Clinician: Lolita Cram Referring Joshua Zeringue: Kandyce Rud Treating Cyan Moultrie/Extender: Altamese [place] in Treatment: 0 Clinic Level of Care Assessment Items TOOL 1 Quantity Score []  - Use when EandM and Procedure is performed on INITIAL visit 0 ASSESSMENTS - Nursing Assessment / Reassessment X - General Physical Exam (combine w/ comprehensive assessment (listed just below) when performed on new 1 20 pt. evals) X- 1 25 Comprehensive Assessment (HX, ROS, Risk Assessments, Wounds Hx, etc.) ASSESSMENTS - Wound and Skin Assessment / Reassessment []  - Dermatologic / Skin Assessment (not related to wound area) 0 ASSESSMENTS - Ostomy and/or Continence Assessment and Care []  - Incontinence Assessment and Management 0 []  - 0 Ostomy Care Assessment and Management (repouching, etc.) PROCESS - Coordination of Care X - Simple Patient / Family Education for ongoing care 1 15 []  - 0 Complex (extensive) Patient / Family Education for ongoing care X- 1 10 Staff obtains Consents, Records, Test Results / Process Orders []  - 0 Staff telephones HHA, Nursing Homes / Clarify orders / etc []  - 0 Routine Transfer to another Facility (non-emergent condition) []  - 0 Routine Hospital Admission (non-emergent condition) X- 1 15 New Admissions / / Ordering NPWT, Apligraf, etc. []  - 0 Emergency Hospital Admission (emergent condition) PROCESS - Special Needs []  - Pediatric / Minor Patient Management 0 []  - 0 Isolation Patient Management []  - 0 Hearing / Language / Visual special needs []  - 0 Assessment of Community assistance (transportation, D/C planning, etc.) []  - 0 Additional assistance / Altered mentation []  - 0 Support Surface(s) Assessment (bed, cushion, seat, etc.) INTERVENTIONS -  Miscellaneous []  - External ear exam 0 []  - 0 Patient Transfer (multiple staff / / Similar devices) []  - 0 Simple Staple / Suture removal (25 or less) []  - 0 Complex Staple / Suture removal (26 or more) []  - 0 Hypo/Hyperglycemic Management (do not check if billed separately) []  - 0 Ankle / Brachial Index (ABI) - do not check if billed separately Has the patient been  seen at the hospital within the last three years: Yes Total Score: 85 Level Of Care: New/Established - Level 3 [name], [name] ([id number]) Electronic Signature(s) Signed: 08/30/2020 1:17:06 PM By: Elliot Gurney, BSN, RN, CWS, Kim RN, BSN Entered By: Elliot Gurney, BSN, RN, CWS, Kim on 08/30/2020 10:58:52 [name], Joshua DMarland Kitchen ([id number]) -------------------------------------------------------------------------------- Encounter Discharge Information Details Patient Name: [name] Date of Service: 08/30/2020 9:30 AM Medical Record Number: [id number] Patient Account Number: [id number] Date of Birth/Sex: 1990-07-09 (29 y.o. M) Treating RN: Rogers Blocker Primary Care Symantha Steeber: Kandyce Rud Other Clinician: Lolita Cram Referring Konnar Ben: Kandyce Rud Treating Esthefany Herrig/Extender: Altamese [place] in Treatment: 0 Encounter Discharge Information Items Post Procedure Vitals Discharge Condition: Stable Unable to obtain vitals 98.6, 70, 18-no bp obtained d/t not having Reason: appropriate size cuff Ambulatory Status: Wheelchair Discharge Destination: Home Transportation: Private Auto Accompanied By: parents Schedule Follow-up Appointment: Yes Clinical Summary of Care: Electronic Signature(s) Signed: 08/30/2020 11:55:23 AM By: Phillis Haggis, Dondra Prader RN Entered By: Phillis Haggis, Dondra Prader on 08/30/2020 11:14:29 [name], [name] ([id number]) -------------------------------------------------------------------------------- Lower Extremity Assessment Details Patient Name: [name] Date of Service: 08/30/2020 9:30 AM Medical Record Number: [id number] Patient Account Number: [id number] Date of Birth/Sex: 1991-05-09 (29 y.o. M) Treating RN: Yevonne Pax Primary Care Pratt Bress: Kandyce Rud Other Clinician: Lolita Cram Referring Corbyn Wildey: Kandyce Rud Treating Khaleem Burchill/Extender: Altamese [place] in Treatment: 0 Electronic Signature(s) Signed: 09/08/2020 8:11:40 AM By: Yevonne Pax RN Entered By: Yevonne Pax on 08/30/2020 10:14:28 [name], [name] ([id number]) -------------------------------------------------------------------------------- Multi Wound Chart Details Patient Name: [name] Date of Service: 08/30/2020 9:30 AM Medical Record Number: [id number] Patient Account Number: [id number] Date of Birth/Sex: 11-Oct-1990 (29 y.o. M) Treating RN: Huel Coventry Primary Care Nazli Penn: Kandyce Rud Other Clinician: Lolita Cram Referring Meryl Ponder: Kandyce Rud Treating Armond Cuthrell/Extender: Altamese [place] in Treatment: 0 Vital Signs Height(in): 54 Pulse(bpm): 70 Weight(lbs): 50 Blood Pressure(mmHg): Body Mass Index(BMI): 12 Temperature(F): 98.6 Respiratory Rate(breaths/min): 18 Photos: [N/A:N/A] Wound Location: Sacrum N/A N/A Wounding Event: Gradually Appeared N/A N/A Primary Etiology: Pressure Ulcer N/A N/A Comorbid History: Type II Diabetes N/A N/A Date Acquired: 08/04/2020 N/A N/A Weeks of Treatment: 0 N/A N/A Wound Status: Open N/A N/A Measurements L x W x D (cm) 2x1.4x1.2 N/A N/A Area (cm) : 2.199 N/A N/A Volume (cm) : 2.639 N/A N/A % Reduction in Area: 0.00% N/A N/A % Reduction in Volume: 0.00% N/A N/A Starting Position 1 (o'clock): 11 Ending Position 1 (o'clock): 4 Maximum Distance 1 (cm): 1 Undermining: Yes N/A N/A Classification: Unstageable/Unclassified N/A N/A Exudate Amount: Medium N/A N/A Exudate Type: Serosanguineous N/A N/A Exudate Color: red, brown N/A N/A Granulation Amount: Small (1-33%)  N/A N/A Granulation Quality: Red N/A N/A Necrotic Amount: Large (67-100%) N/A N/A Exposed Structures: Fascia: No N/A N/A Fat Layer (Subcutaneous Tissue): No Tendon: No Muscle: No Joint: No Bone: No Epithelialization: None N/A N/A Treatment Notes Electronic Signature(s) Signed: 08/30/2020 1:17:06 PM By: Elliot Gurney, BSN, RN, CWS, Kim RN, BSN Entered By: Elliot Gurney, BSN, RN, CWS, Kim on 08/30/2020 10:45:53 [name], Joshua DMarland Kitchen ([id number]) -------------------------------------------------------------------------------- Multi-Disciplinary Care Plan Details Patient Name: [name] Date of Service: 08/30/2020 9:30 AM Medical Record Number: [id number] Patient Account Number: [id number] Date of Birth/Sex: [birth year] (29 y.o. M) Treating RN: Huel Coventry Primary Care Monae Topping: Kandyce Rud Other Clinician: Lolita Cram Referring Kenlei Safi: Kandyce Rud Treating Marlane Hirschmann/Extender: Altamese [place] in Treatment: 0 Active Inactive Necrotic Tissue Nursing Diagnoses: Impaired tissue integrity related to necrotic/devitalized tissue Goals: Necrotic/devitalized tissue will be minimized in the wound  bed Date Initiated: 08/30/2020 Target Resolution Date: 09/06/2020 Goal Status: Active Interventions: Assess patient pain level pre-, during and post procedure and prior to discharge Treatment Activities: Enzymatic debridement : 08/30/2020 Notes: Orientation to the Wound Care Program Nursing Diagnoses: Knowledge deficit related to the wound healing center program Goals: Patient/caregiver will verbalize understanding of the Wound Healing Center Program Date Initiated: 08/30/2020 Target Resolution Date: 08/30/2020 Goal Status: Active Interventions: Provide education on orientation to the wound center Notes: Pressure Nursing Diagnoses: Knowledge deficit related to management of pressures ulcers Potential for impaired tissue integrity related to pressure, friction, moisture, and  shear Goals: Patient will remain free from development of additional pressure ulcers Date Initiated: 08/30/2020 Target Resolution Date: 09/20/2020 Goal Status: Active Patient/caregiver will verbalize understanding of pressure ulcer management Date Initiated: 08/30/2020 Target Resolution Date: 09/20/2020 Goal Status: Active Interventions: Provide education on pressure ulcers Notes: Wound/Skin Impairment Nursing Diagnoses: Joshua HoraMOREFIELD, [name] ([id number]) Knowledge deficit related to ulceration/compromised skin integrity Goals: Patient/caregiver will verbalize understanding of skin care regimen Date Initiated: 08/30/2020 Target Resolution Date: 09/06/2020 Goal Status: Active Ulcer/skin breakdown will have a volume reduction of 30% by week 4 Date Initiated: 08/30/2020 Target Resolution Date: 09/30/2020 Goal Status: Active Interventions: Assess ulceration(s) every visit Provide education on ulcer and skin care Treatment Activities: Referred to DME Roben Tatsch for dressing supplies : 08/30/2020 Skin care regimen initiated : 08/30/2020 Topical wound management initiated : 08/30/2020 Notes: Electronic Signature(s) Signed: 08/30/2020 1:17:06 PM By: Elliot GurneyWoody, BSN, RN, CWS, Kim RN, BSN Entered By: Elliot GurneyWoody, BSN, RN, CWS, Kim on 08/30/2020 10:44:34 Whiteaker, Saahil D. ([id number]) -------------------------------------------------------------------------------- Pain Assessment Details Patient Name: Joshua HoraMOREFIELD, [name] Date of Service: 08/30/2020 9:30 AM Medical Record Number: [id number] Patient Account Number: [id number] Date of Birth/Sex: 1990/10/20 (29 y.o. M) Treating RN: Yevonne PaxEpps, Carrie Primary Care Alexcia Schools: Kandyce RudBabaoff, Marcus Other Clinician: Lolita CramBurnette, Kyara Referring Stacy Sailer: Kandyce RudBabaoff, Marcus Treating Zyanne Schumm/Extender: Altamese CarolinaOBSON, MICHAEL G Weeks in Treatment: 0 Active Problems Location of Pain Severity and Description of Pain Patient Has Paino No Site Locations Pain Management and  Medication Current Pain Management: Electronic Signature(s) Signed: 09/08/2020 8:11:40 AM By: Yevonne PaxEpps, Carrie RN Entered By: Yevonne PaxEpps, Carrie on 08/30/2020 10:04:39 Doescher, Briana D. ([id number]) -------------------------------------------------------------------------------- Patient/Caregiver Education Details Patient Name: Joshua HoraMOREFIELD, [name] Date of Service: 08/30/2020 9:30 AM Medical Record Number: [id number] Patient Account Number: [id number] Date of Birth/Gender: 1990/10/20 (29 y.o. M) Treating RN: Huel CoventryWoody, Kim Primary Care Physician: Kandyce RudBabaoff, Marcus Other Clinician: Lolita CramBurnette, Kyara Referring Physician: Kandyce RudBabaoff, Marcus Treating Physician/Extender: Altamese CarolinaOBSON, MICHAEL G Weeks in Treatment: 0 Education Assessment Education Provided To: Caregiver Education Topics Provided Pressure: Handouts: Pressure Ulcers: Care and Offloading Methods: Demonstration, Explain/Verbal Responses: State content correctly Welcome To The Wound Care Center: Handouts: Welcome To The Wound Care Center Methods: Demonstration, Explain/Verbal Responses: State content correctly Wound/Skin Impairment: Handouts: Caring for Your Ulcer Methods: Demonstration, Explain/Verbal Responses: State content correctly Electronic Signature(s) Signed: 08/30/2020 1:17:06 PM By: Elliot GurneyWoody, BSN, RN, CWS, Kim RN, BSN Entered By: Elliot GurneyWoody, BSN, RN, CWS, Kim on 08/30/2020 10:59:26 [name], Joshua Marland Kitchen. ([id number]) -------------------------------------------------------------------------------- Wound Assessment Details Patient Name: Joshua HoraMOREFIELD, [name] Date of Service: 08/30/2020 9:30 AM Medical Record Number: [id number] Patient Account Number: [id number] Date of Birth/Sex: 1990/10/20 (29 y.o. M) Treating RN: Yevonne PaxEpps, Carrie Primary Care Chandell Attridge: Kandyce RudBabaoff, Marcus Other Clinician: Lolita CramBurnette, Kyara Referring Lucella Pommier: Kandyce RudBabaoff, Marcus Treating Calin Ellery/Extender: Altamese CarolinaOBSON, MICHAEL G Weeks in Treatment: 0 Wound Status Wound Number: 1 Primary  Etiology: Pressure Ulcer Wound Location: Sacrum Wound Status: Open Wounding Event: Gradually Appeared Comorbid History: Type II Diabetes Date Acquired: 08/04/2020 Weeks Of Treatment: 0  Clustered Wound: No Photos Wound Measurements Length: (cm) 2 Width: (cm) 1.4 Depth: (cm) 1.2 Area: (cm) 2.199 Volume: (cm) 2.639 % Reduction in Area: 0% % Reduction in Volume: 0% Epithelialization: None Tunneling: No Undermining: Yes Starting Position (o'clock): 11 Ending Position (o'clock): 4 Maximum Distance: (cm) 1 Wound Description Classification: Unstageable/Unclassified Exudate Amount: Medium Exudate Type: Serosanguineous Exudate Color: red, brown Foul Odor After Cleansing: No Slough/Fibrino Yes Wound Bed Granulation Amount: Small (1-33%) Exposed Structure Granulation Quality: Red Fascia Exposed: No Necrotic Amount: Large (67-100%) Fat Layer (Subcutaneous Tissue) Exposed: No Necrotic Quality: Adherent Slough Tendon Exposed: No Muscle Exposed: No Joint Exposed: No Bone Exposed: No Electronic Signature(s) Signed: 08/30/2020 1:17:06 PM By: Elliot Gurney, BSN, RN, CWS, Kim RN, BSN Signed: 09/08/2020 8:11:40 AM By: Yevonne Pax RN Entered By: Elliot Gurney, BSN, RN, CWS, Kim on 08/30/2020 10:45:39 Dalal, Torres D. ([id number]) -------------------------------------------------------------------------------- Vitals Details Patient Name: [name] Date of Service: 08/30/2020 9:30 AM Medical Record Number: [id number] Patient Account Number: [id number] Date of Birth/Sex: 06/24/1990 (29 y.o. M) Treating RN: Yevonne Pax Primary Care Kohana Amble: Kandyce Rud Other Clinician: Lolita Cram Referring Haidynn Almendarez: Kandyce Rud Treating Yahmir Sokolov/Extender: Altamese [place] in Treatment: 0 Vital Signs Time Taken: 10:04 Temperature (F): 98.6 Height (in): 54 Pulse (bpm): 70 Source: Stated Respiratory Rate (breaths/min): 18 Weight (lbs): 50 Reference Range: 80 - 120 mg /  dl Source: Stated Body Mass Index (BMI): 12.1 Electronic Signature(s) Signed: 09/08/2020 8:11:40 AM By: Yevonne Pax RN Entered By: Yevonne Pax on 08/30/2020 10:07:10

## 2020-09-08 NOTE — Progress Notes (Signed)
AUDIE, STAYER ([id number]) Visit Report for 08/30/2020 Abuse/Suicide Risk Screen Details Patient Name: [name], [name] Date of Service: 08/30/2020 9:30 AM Medical Record Number: [id number] Patient Account Number: [id number] Date of Birth/Sex: [birth year] (29 y.o. M) Treating RN: Yevonne Pax Primary Care Micheal Murad: Kandyce Rud Other Clinician: Lolita Cram Referring Farmer Mccahill: Kandyce Rud Treating Keny Donald/Extender: Altamese [place] in Treatment: 0 Abuse/Suicide Risk Screen Items Answer ABUSE RISK SCREEN: Has anyone close to you tried to hurt or harm you recentlyo No Do you feel uncomfortable with anyone in your familyo No Has anyone forced you do things that you didnot want to doo No Electronic Signature(s) Signed: 09/08/2020 8:11:40 AM By: Yevonne Pax RN Entered By: Yevonne Pax on 08/30/2020 10:18:57 Noll, Osie D. ([id number]) -------------------------------------------------------------------------------- Activities of Daily Living Details Patient Name: [name] Date of Service: 08/30/2020 9:30 AM Medical Record Number: [id number] Patient Account Number: [id number] Date of Birth/Sex: 05-28-1991 (29 y.o. M) Treating RN: Yevonne Pax Primary Care Nimco Bivens: Kandyce Rud Other Clinician: Lolita Cram Referring Ayanah Snader: Kandyce Rud Treating Jhoel Stieg/Extender: Altamese [place] in Treatment: 0 Activities of Daily Living Items Answer Activities of Daily Living (Please select one for each item) Drive Automobile Not Able Take Medications Not Able Use Telephone Not Able Care for Appearance Not Able Use Toilet Not Able Mady Haagensen / Shower Not Able Dress Self Not Able Feed Self Not Able Walk Not Able Get In / Out Bed Not Able Housework Not Able Prepare Meals Not Able Handle Money Not Able Shop for Self Not Able Electronic Signature(s) Signed: 09/08/2020 8:11:40 AM By: Yevonne Pax RN Entered By: Yevonne Pax on 08/30/2020  10:19:28 Alcalde, Kristi D. ([id number]) -------------------------------------------------------------------------------- Education Screening Details Patient Name: [name] Date of Service: 08/30/2020 9:30 AM Medical Record Number: [id number] Patient Account Number: [id number] Date of Birth/Sex: 03/29/1991 (29 y.o. M) Treating RN: Yevonne Pax Primary Care Airabella Barley: Kandyce Rud Other Clinician: Lolita Cram Referring Zuriah Bordas: Kandyce Rud Treating Luise Yamamoto/Extender: Altamese [place] in Treatment: 0 Primary Learner Assessed: Caregiver Reason Patient is not Primary Learner: cerbral palsy Learning Preferences/Education Level/Primary Language Learning Preference: Explanation Preferred Language: English Cognitive Barrier Language Barrier: No Translator Needed: No Memory Deficit: No Emotional Barrier: No Cultural/Religious Beliefs Affecting Medical Care: No Physical Barrier Impaired Vision: No Impaired Hearing: No Decreased Hand dexterity: No Knowledge/Comprehension Knowledge Level: High Comprehension Level: High Ability to understand written instructions: High Ability to understand verbal instructions: High Motivation Anxiety Level: Anxious Cooperation: Cooperative Education Importance: Acknowledges Need Interest in Health Problems: Asks Questions Perception: Coherent Willingness to Engage in Self-Management High Activities: Readiness to Engage in Self-Management High Activities: Electronic Signature(s) Signed: 09/08/2020 8:11:40 AM By: Yevonne Pax RN Entered By: Yevonne Pax on 08/30/2020 10:20:38 Borjon, Keats D. ([id number]) -------------------------------------------------------------------------------- Fall Risk Assessment Details Patient Name: [name] Date of Service: 08/30/2020 9:30 AM Medical Record Number: [id number] Patient Account Number: [id number] Date of Birth/Sex: [birth year] (29 y.o. M) Treating RN: Yevonne Pax Primary Care Lucianna Ostlund: Kandyce Rud Other Clinician: Lolita Cram Referring Dodie Parisi: Kandyce Rud Treating Kimi Kroft/Extender: Altamese [place] in Treatment: 0 Fall Risk Assessment Items Have you had 2 or more falls in the last 12 monthso 0 No Have you had any fall that resulted in injury in the last 12 monthso 0 No FALLS RISK SCREEN History of falling - immediate or within 3 months 0 No Secondary diagnosis (Do you have 2 or more medical diagnoseso) 0 No Ambulatory aid None/bed rest/wheelchair/nurse 0 No Crutches/cane/walker 0 No Furniture 0 No Intravenous therapy Access/Saline/Heparin  Lock 0 No Gait/Transferring Normal/ bed rest/ wheelchair 0 No Weak (short steps with or without shuffle, stooped but able to lift head while walking, may 0 No seek support from furniture) Impaired (short steps with shuffle, may have difficulty arising from chair, head down, impaired 0 No balance) Mental Status Oriented to own ability 0 No Electronic Signature(s) Signed: 09/08/2020 8:11:40 AM By: Yevonne Pax RN Entered By: Yevonne Pax on 08/30/2020 10:20:47 Padget, Avant D. ([id number]) -------------------------------------------------------------------------------- Foot Assessment Details Patient Name: [name] Date of Service: 08/30/2020 9:30 AM Medical Record Number: [id number] Patient Account Number: [id number] Date of Birth/Sex: 1990/11/23 (29 y.o. M) Treating RN: Yevonne Pax Primary Care Magdeline Prange: Kandyce Rud Other Clinician: Lolita Cram Referring Elianne Gubser: Kandyce Rud Treating Jaramiah Bossard/Extender: Altamese [place] in Treatment: 0 Foot Assessment Items [x]  Unable to perform due to altered mental status Site Locations + = Sensation present, - = Sensation absent, C = Callus, U = Ulcer R = Redness, W = Warmth, M = Maceration, PU = Pre-ulcerative lesion F = Fissure, S = Swelling, D = Dryness Assessment Right: Left: Other  Deformity: No No Prior Foot Ulcer: No No Prior Amputation: No No Charcot Joint: No No Ambulatory Status: Gait: Electronic Signature(s) Signed: 09/08/2020 8:11:40 AM By: 11/08/2020 RN Entered By: Yevonne Pax on 08/30/2020 10:21:37 Ousley, Robby D. (09/01/2020) -------------------------------------------------------------------------------- Nutrition Risk Screening Details Patient Name: [id number] Date of Service: 08/30/2020 9:30 AM Medical Record Number: 09/01/2020 Patient Account Number: [id number] Date of Birth/Sex: [birth year] (29 y.o. M) Treating RN: 10-10-1980 Primary Care Stylianos Stradling: Yevonne Pax Other Clinician: Kandyce Rud Referring Bexton Haak: Lolita Cram Treating Jarmar Rousseau/Extender: Kandyce Rud in Treatment: 0 Height (in): 54 Weight (lbs): 50 Body Mass Index (BMI): 12.1 Nutrition Risk Screening Items Score Screening NUTRITION RISK SCREEN: I have an illness or condition that made me change the kind and/or amount of food I eat 2 Yes I eat fewer than two meals per day 0 No I eat few fruits and vegetables, or milk products 0 No I have three or more drinks of beer, liquor or wine almost every day 0 No I have tooth or mouth problems that make it hard for me to eat 2 Yes I don't always have enough money to buy the food I need 0 No I eat alone most of the time 0 No I take three or more different prescribed or over-the-counter drugs a day 1 Yes Without wanting to, I have lost or gained 10 pounds in the last six months 2 Yes I am not always physically able to shop, cook and/or feed myself 2 Yes Nutrition Protocols Good Risk Protocol Moderate Risk Protocol High Risk Proctocol 0 Provide education on nutrition Risk Level: High Risk Score: 9 Electronic Signature(s) Signed: 09/08/2020 8:11:40 AM By: 11/08/2020 RN Entered By: Yevonne Pax on 08/30/2020 10:21:26

## 2020-09-08 NOTE — Progress Notes (Signed)
[name], [name] ([id number]) Visit Report for 08/30/2020 Chief Complaint Document Details Patient Name: [name], [name] Date of Service: 08/30/2020 9:30 AM Medical Record Number: [id number] Patient Account Number: [id number] Date of Birth/Sex: 1990/10/16 (30 y.o. M) Treating RN: Cornell Barman Primary Care Provider: Derinda Late Other Clinician: Jeanine Luz Referring Provider: Derinda Late Treating Provider/Extender: Tito Dine in Treatment: 0 Information Obtained from: Patient Chief Complaint 08/30/2020; patient is here for review of a pressure ulcer on his sacrum Electronic Signature(s) Signed: 08/31/2020 8:16:57 AM By: Linton Ham MD Entered By: Linton Ham on 08/30/2020 11:30:29 [name], [name] ([id number]) -------------------------------------------------------------------------------- Debridement Details Patient Name: [name] Date of Service: 08/30/2020 9:30 AM Medical Record Number: [id number] Patient Account Number: [id number] Date of Birth/Sex: [birth year] (30 y.o. M) Treating RN: Cornell Barman Primary Care Provider: Derinda Late Other Clinician: Jeanine Luz Referring Provider: Derinda Late Treating Provider/Extender: Tito Dine in Treatment: 0 Debridement Performed for Wound #1 Sacrum Assessment: Performed By: Physician Ricard Dillon, MD Debridement Type: Debridement Level of Consciousness (Pre- Awake and Alert procedure): Pre-procedure Verification/Time Out Yes - 10:49 Taken: Total Area Debrided (L x W): 2 (cm) x 1.4 (cm) = 2.8 (cm) Tissue and other material Viable, Non-Viable, Slough, Subcutaneous, Slough debrided: Level: Skin/Subcutaneous Tissue Debridement Description: Excisional Instrument: Curette Specimen: Swab, Number of Specimens Taken: 1 Bleeding: Moderate Hemostasis Achieved: Pressure Response to Treatment: Procedure was tolerated well Level of Consciousness  (Post- Responds to Painful Stimuli procedure): Post Debridement Measurements of Total Wound Length: (cm) 2 Stage: Unstageable/Unclassified Width: (cm) 1.4 Depth: (cm) 1.2 Volume: (cm) 2.639 Character of Wound/Ulcer Post Debridement: Stable Post Procedure Diagnosis Same as Pre-procedure Electronic Signature(s) Signed: 08/30/2020 1:17:06 PM By: Gretta Cool, BSN, RN, CWS, Kim RN, BSN Signed: 08/31/2020 8:16:57 AM By: Linton Ham MD Entered By: Gretta Cool, BSN, RN, CWS, Kim on 08/30/2020 10:52:37 [name], [name] ([id number]) -------------------------------------------------------------------------------- HPI Details Patient Name: [name] Date of Service: 08/30/2020 9:30 AM Medical Record Number: [id number] Patient Account Number: [id number] Date of Birth/Sex: September 10, 1990 (30 y.o. M) Treating RN: Cornell Barman Primary Care Provider: Derinda Late Other Clinician: Jeanine Luz Referring Provider: Derinda Late Treating Provider/Extender: Tito Dine in Treatment: 0 History of Present Illness HPI Description: ADMISSION 08/30/2020 This is a difficult case of a unfortunate 30 year old man with cerebral palsy and seizures. 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 This caused quite a deterioration recently in his function he required placement of a PEG tube for feeding, he developed a significant left upper lobe aspiration driven lung met lung abscess for which she is taken Augmentin. He is nonambulatory. Nevertheless his parents were here today feel that he had ability to communicate and a good functional level up until very recently. He was hospitalized at the beginning of March for the pneumonia. During this time he was put in one of the hospital standard pressure prevention mattresses although this was  apparently meant for an adult resulting in him sliding down and it and he developed a pressure sore on his lower sacrum. 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 They have been using Santyl. Apparently the nurse practitioner for palliative care who comes to  see him in the home prescribed this Past medical history includes cerebral palsy with seizures, autoimmune encephalitis. He is a diabetic most recently listed in the [facility] records as type I based on low protein C levels and an anti-GAD antibody however his parents state that this has recently been called into question however I did not delve into this further. He apparently has some form of ulcerative colitis, history of candidemia, nephrolithiasis, he is tube feed dependent although they still give him something orally. Left upper lobe lung abscess followed by ID at [facility] currently on Augmentin Electronic Signature(s) Signed: 08/31/2020 8:16:57 AM By: Linton Ham MD Entered By: Linton Ham on 08/30/2020 11:34:14 [name], [name] ([id number]) -------------------------------------------------------------------------------- Physical Exam Details Patient Name: [name] Date of Service: 08/30/2020 9:30 AM Medical Record Number: [id number] Patient Account Number: [id number] Date of Birth/Sex: 08-Nov-1990 (30 y.o. M) Treating RN: Cornell Barman Primary Care Provider: Derinda Late Other Clinician: Jeanine Luz Referring Provider: Derinda Late Treating Provider/Extender: Tito Dine in Treatment: 0 Constitutional Pulse regular and within target range for patient.Marland Kitchen Respirations regular, non-labored and within target range.. Temperature is normal and within the target range for the patient.. Very frail looking patient. Gastrointestinal (GI) PEG tube present no masses  no liver no spleen. Psychiatric He was able to tell me bye. Notes Wound exam; he has a protruding lower sacrum/coccyx which his parents say has been there for a long period of time. He has mostly necrotic tissue over the surface however it did not take much to realize that there is exposed bone here as well. This protrudes into the wound. Most of the rest of this is necrotic tissue there is undermining from about 11-4 o'clock at about a centimeter. I removed some of the slough around the rest of the wound. There may be some viable tissue here. Hemostasis with direct pressure after debridement I did a culture for CandS Electronic Signature(s) Signed: 08/31/2020 8:16:57 AM By: Linton Ham MD Entered By: Linton Ham on 08/30/2020 11:36:06 [name], [name] ([id number]) -------------------------------------------------------------------------------- Physician Orders Details Patient Name: [name] Date of Service: 08/30/2020 9:30 AM Medical Record Number: [id number] Patient Account Number: [id number] Date of Birth/Sex: 03-08-1991 (30 y.o. M) Treating RN: Cornell Barman Primary Care Provider: Derinda Late Other Clinician: Jeanine Luz Referring Provider: Derinda Late Treating Provider/Extender: Tito Dine in Treatment: 0 Verbal / Phone Orders: No Diagnosis Coding Follow-up Appointments Wound #1 Sacrum o Return Appointment in 1 week. Bathing/ Shower/ Hygiene Wound #1 Sacrum o Clean wound with Normal Saline or wound cleanser. Off-Loading o Turn and reposition every 2 hours o Other: - Keep all pressure of of wounded area Wound Treatment Wound #1 - Sacrum Cleanser: Normal Saline 1 x Per Day/30 Days Discharge Instructions: Wash your hands with soap and water. Remove old dressing, discard into plastic bag and place into trash. Cleanse the wound with Normal Saline prior to applying a clean dressing using gauze sponges, not tissues or cotton  balls. Do not scrub or use excessive force. Pat dry using gauze sponges, not tissue or cotton balls. Topical: Santyl Collagenase Ointment, 30 (gm), tube 1 x Per Day/30 Days Discharge Instructions: apply nickel thick to wound bed only Secondary Dressing: Mepilex Border Flex, 4x4 (in/in) 1 x Per Day/30 Days Discharge Instructions: Apply to wound as directed. Do not cut. Electronic Signature(s) Signed: 08/30/2020 11:55:23 AM By: Georges Mouse, Minus Breeding RN Signed: 08/31/2020 8:16:57 AM By: Linton Ham MD Entered By: Georges Mouse, Minus Breeding on 08/30/2020 11:12:09 [name], Joshua  DMarland Kitchen ([id number]) -------------------------------------------------------------------------------- Problem List Details Patient Name: SHEAMUS, HASTING Date of Service: 08/30/2020 9:30 AM Medical Record Number: [id number] Patient Account Number: [id number] Date of Birth/Sex: December 09, 1990 (30 y.o. M) Treating RN: Cornell Barman Primary Care Provider: Derinda Late Other Clinician: Jeanine Luz Referring Provider: Derinda Late Treating Provider/Extender: Tito Dine in Treatment: 0 Active Problems ICD-10 Encounter Code Description Active Date MDM Diagnosis L89.154 Pressure ulcer of sacral region, stage 4 08/30/2020 No Yes E43 Unspecified severe protein-calorie malnutrition 08/30/2020 No Yes G80.9 Cerebral palsy, unspecified 08/30/2020 No Yes Inactive Problems Resolved Problems Electronic Signature(s) Signed: 08/31/2020 8:16:57 AM By: Linton Ham MD Entered By: Linton Ham on 08/30/2020 11:05:52 Gayton, Tahmid [name]. ([id number]) -------------------------------------------------------------------------------- Progress Note Details Patient Name: [name] Date of Service: 08/30/2020 9:30 AM Medical Record Number: [id number] Patient Account Number: [id number] Date of Birth/Sex: 1991-05-10 (30 y.o. M) Treating RN: Cornell Barman Primary Care Provider: Derinda Late Other Clinician:  Jeanine Luz Referring Provider: Derinda Late Treating Provider/Extender: Tito Dine in Treatment: 0 Subjective Chief Complaint Information obtained from Patient 08/30/2020; patient is here for review of a pressure ulcer on his sacrum History of Present Illness (HPI) ADMISSION 08/30/2020 This is a difficult case of a unfortunate 30 year old man with cerebral palsy and seizures. 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 This caused quite a deterioration recently in his function he required placement of a PEG tube for feeding, he developed a significant left upper lobe aspiration driven lung met lung abscess for which she is taken Augmentin. He is nonambulatory. Nevertheless his parents were here today feel that he had ability to communicate and a good functional level up until very recently. He was hospitalized at the beginning of March for the pneumonia. During this time he was put in one of the hospital standard pressure prevention mattresses although this was apparently meant for an adult resulting in him sliding down and it and he developed a pressure sore on his lower sacrum. 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 They have been using Santyl. Apparently the nurse practitioner for palliative care who comes to see him in the home prescribed this Past medical history includes cerebral palsy with seizures, autoimmune encephalitis. He is a diabetic most recently listed in the [facility] records as type I based on low protein C levels and an anti-GAD antibody however his parents state that this has recently been called into question however I did not delve into this further. He  apparently has some form of ulcerative colitis, history of candidemia, nephrolithiasis, he is tube feed dependent although they still give him something orally. Left upper lobe lung abscess followed by ID at [facility] currently on Augmentin Patient History Allergies albuterol, morphine, phenobarbital, phenytoin sodium, Sulfa (Sulfonamide Antibiotics), amoxicillin, bacitracin Social History Never smoker, Marital Status - Single, Alcohol Use - Never, Drug Use - No History, Caffeine Use - Never. Medical History Eyes Denies history of Cataracts, Glaucoma, Optic Neuritis Ear/Nose/Mouth/Throat Denies history of Chronic sinus problems/congestion, Middle ear problems Hematologic/Lymphatic Denies history of Anemia, Hemophilia, Human Immunodeficiency Virus, Lymphedema, Sickle Cell Disease Respiratory Denies history of Aspiration, Asthma, Chronic Obstructive Pulmonary Disease (COPD), Pneumothorax, Sleep Apnea, Tuberculosis  Cardiovascular Denies history of Angina, Arrhythmia, Congestive Heart Failure, Coronary Artery Disease, Deep Vein Thrombosis, Hypertension, Hypotension, Myocardial Infarction, Peripheral Arterial Disease, Peripheral Venous Disease, Phlebitis, Vasculitis Gastrointestinal Denies history of Cirrhosis , Colitis, Crohn s, Hepatitis A, Hepatitis B Endocrine Patient has history of Type II Diabetes Denies history of Type I Diabetes Genitourinary Denies history of End Stage Renal Disease Immunological Denies history of Lupus Erythematosus, Raynaud s, Scleroderma Integumentary (Skin) Denies history of History of Burn, History of pressure wounds Musculoskeletal Denies history of Gout, Rheumatoid Arthritis, Osteoarthritis, Osteomyelitis Neurologic Denies history of Dementia, Neuropathy, Quadriplegia, Paraplegia, Seizure Disorder Oncologic Denies history of Received Chemotherapy, Received Radiation Psychiatric Denies history of Anorexia/bulimia, Confinement Anxiety Patient is treated with  Insulin. Lawless, Alegandro [name]. ([id number]) Review of Systems (ROS) Constitutional Symptoms (General Health) Denies complaints or symptoms of Fatigue, Fever, Chills, Marked Weight Change. Eyes Denies complaints or symptoms of Dry Eyes, Vision Changes, Glasses / Contacts, legally blind Ear/Nose/Mouth/Throat Denies complaints or symptoms of Difficult clearing ears, Sinusitis. Hematologic/Lymphatic Denies complaints or symptoms of Bleeding / Clotting Disorders, Human Immunodeficiency Virus. Respiratory Denies complaints or symptoms of Chronic or frequent coughs, Shortness of Breath. Cardiovascular Denies complaints or symptoms of Chest pain, LE edema. Gastrointestinal Denies complaints or symptoms of Frequent diarrhea, Nausea, Vomiting. Endocrine Denies complaints or symptoms of Hepatitis, Thyroid disease, Polydypsia (Excessive Thirst). Genitourinary Denies complaints or symptoms of Kidney failure/ Dialysis, Incontinence/dribbling. Immunological Denies complaints or symptoms of Hives, Itching. Integumentary (Skin) Complains or has symptoms of Wounds. Denies complaints or symptoms of Bleeding or bruising tendency, Breakdown, Swelling. Musculoskeletal Denies complaints or symptoms of Muscle Pain, Muscle Weakness. Neurologic Denies complaints or symptoms of Numbness/parasthesias, Focal/Weakness. Psychiatric Denies complaints or symptoms of Anxiety, Claustrophobia. Objective Constitutional Pulse regular and within target range for patient.Marland Kitchen Respirations regular, non-labored and within target range.. Temperature is normal and within the target range for the patient.. Very frail looking patient. Vitals Time Taken: 10:04 AM, Height: 54 in, Source: Stated, Weight: 50 lbs, Source: Stated, BMI: 12.1, Temperature: 98.6 F, Pulse: 70 bpm, Respiratory Rate: 18 breaths/min. Gastrointestinal (GI) PEG tube present no masses no liver no spleen. Psychiatric He was able to tell me bye. General  Notes: Wound exam; he has a protruding lower sacrum/coccyx which his parents say has been there for a long period of time. He has mostly necrotic tissue over the surface however it did not take much to realize that there is exposed bone here as well. This protrudes into the wound. Most of the rest of this is necrotic tissue there is undermining from about 11-4 o'clock at about a centimeter. I removed some of the slough around the rest of the wound. There may be some viable tissue here. Hemostasis with direct pressure after debridement I did a culture for CandS Integumentary (Hair, Skin) Wound #1 status is Open. Original cause of wound was Gradually Appeared. The date acquired was: 08/04/2020. The wound is located on the Sacrum. The wound measures 2cm length x 1.4cm width x 1.2cm depth; 2.199cm^2 area and 2.639cm^3 volume. There is no tunneling noted, however, there is undermining starting at 11:00 and ending at 4:00 with a maximum distance of 1cm. There is a medium amount of serosanguineous drainage noted. There is small (1-33%) red granulation within the wound bed. There is a large (67-100%) amount of necrotic tissue within the wound bed including Adherent Slough. Assessment Active Problems [name], [name] ([id number]) ICD-10 Pressure ulcer of sacral region, stage 4 Unspecified severe protein-calorie malnutrition Cerebral palsy, unspecified Procedures Wound #1 Pre-procedure  diagnosis of Wound #1 is a Pressure Ulcer located on the Sacrum . There was a Excisional Skin/Subcutaneous Tissue Debridement with a total area of 2.8 sq cm performed by Ricard Dillon, MD. With the following instrument(s): Curette to remove Viable and Non-Viable tissue/material. Material removed includes Subcutaneous Tissue and Slough and. 1 specimen was taken by a Swab and sent to the lab per facility protocol. A time out was conducted at 10:49, prior to the start of the procedure. A Moderate amount of bleeding was  controlled with Pressure. The procedure was tolerated well. Post Debridement Measurements: 2cm length x 1.4cm width x 1.2cm depth; 2.639cm^3 volume. Post debridement Stage noted as Unstageable/Unclassified. Character of Wound/Ulcer Post Debridement is stable. Post procedure Diagnosis Wound #1: Same as Pre-Procedure Plan Follow-up Appointments: Wound #1 Sacrum: Return Appointment in 1 week. Bathing/ Shower/ Hygiene: Wound #1 Sacrum: Clean wound with Normal Saline or wound cleanser. Off-Loading: Turn and reposition every 2 hours Other: - Keep all pressure of of wounded area WOUND #1: - Sacrum Wound Laterality: Cleanser: Normal Saline 1 x Per Day/30 Days Discharge Instructions: Wash your hands with soap and water. Remove old dressing, discard into plastic bag and place into trash. Cleanse the wound with Normal Saline prior to applying a clean dressing using gauze sponges, not tissues or cotton balls. Do not scrub or use excessive force. Pat dry using gauze sponges, not tissue or cotton balls. Topical: Santyl Collagenase Ointment, 30 (gm), tube 1 x Per Day/30 Days Discharge Instructions: apply nickel thick to wound bed only Secondary Dressing: Mepilex Border Flex, 4x4 (in/in) 1 x Per Day/30 Days Discharge Instructions: Apply to wound as directed. Do not cut. 1. 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 Nevertheless the wound was acute at the beginning of the month apparently while hospitalized at [facility]. 2. This will not be an easy wound to heal. Most of the center of this is protuberant bone. There may be some healthy granulation tissue distally however it is covered in necrotic debris I did a very gentle debridement trying to remove some of this well maintaining any degree of granulation tissue that is available. 3. I agree with the Santyl that they are currently using at least for the next week or 2. 4. I did a  culture of the area post debridement no empiric antibiotics. I had like to get an x-ray of this area looking at the underlying bone, soft tissue gas etc. He is already on Augmentin for a lung abscess on the left and pneumonia started at [facility] at the beginning of the month 5. His parents are already well versed in the need to keep pressure off this area. They have a water bed at home but again I urged left to right turning. They also have built up pressure relief surfaces when he is up in the chair and that they are sure keeps this area floating. I asked him to be meticulous about this. 6. Eventually I will probably move to a collagen if I can get this some reasonable tissue. An advanced treatment product might also be a thought here. I do not think that this could stand a wound VAC 7. He was at one point fairly severely malnourished although he has had tube feeding and seems to have settled down with a bolus feed regimen 6 times a day. Last albumin I see was from about a week or 2 ago at [facility] his albumin  was 3.1 which is indicative of moderate not severe protein calorie malnutrition 8. I do not know that I can see this young man is a candidate for plastic surgery at this point. Electronic Signature(s) Signed: 08/31/2020 8:16:57 AM By: Linton Ham MD Entered By: Linton Ham on 08/30/2020 11:43:37 [name], [name] ([id number]) -------------------------------------------------------------------------------- ROS/PFSH Details Patient Name: [name] Date of Service: 08/30/2020 9:30 AM Medical Record Number: [id number] Patient Account Number: [id number] Date of Birth/Sex: 1991/01/09 (30 y.o. M) Treating RN: Carlene Coria Primary Care Provider: Derinda Late Other Clinician: Jeanine Luz Referring Provider: Derinda Late Treating Provider/Extender: Tito Dine in Treatment: 0 Constitutional Symptoms (General Health) Complaints and Symptoms: Negative for:  Fatigue; Fever; Chills; Marked Weight Change Eyes Complaints and Symptoms: Negative for: Dry Eyes; Vision Changes; Glasses / Contacts Review of System Notes: legally blind Medical History: Negative for: Cataracts; Glaucoma; Optic Neuritis Ear/Nose/Mouth/Throat Complaints and Symptoms: Negative for: Difficult clearing ears; Sinusitis Medical History: Negative for: Chronic sinus problems/congestion; Middle ear problems Hematologic/Lymphatic Complaints and Symptoms: Negative for: Bleeding / Clotting Disorders; Human Immunodeficiency Virus Medical History: Negative for: Anemia; Hemophilia; Human Immunodeficiency Virus; Lymphedema; Sickle Cell Disease Respiratory Complaints and Symptoms: Negative for: Chronic or frequent coughs; Shortness of Breath Medical History: Negative for: Aspiration; Asthma; Chronic Obstructive Pulmonary Disease (COPD); Pneumothorax; Sleep Apnea; Tuberculosis Cardiovascular Complaints and Symptoms: Negative for: Chest pain; LE edema Medical History: Negative for: Angina; Arrhythmia; Congestive Heart Failure; Coronary Artery Disease; Deep Vein Thrombosis; Hypertension; Hypotension; Myocardial Infarction; Peripheral Arterial Disease; Peripheral Venous Disease; Phlebitis; Vasculitis Gastrointestinal Complaints and Symptoms: Negative for: Frequent diarrhea; Nausea; Vomiting Medical History: Negative for: Cirrhosis ; Colitis; Crohnos; Hepatitis A; Hepatitis B Endocrine Complaints and Symptoms: Negative for: Hepatitis; Thyroid disease; Polydypsia (Excessive Thirst) Medical History: [name], [name] ([id number]) Positive for: Type II Diabetes Negative for: Type I Diabetes Time with diabetes: 3 Treated with: Insulin Genitourinary Complaints and Symptoms: Negative for: Kidney failure/ Dialysis; Incontinence/dribbling Medical History: Negative for: End Stage Renal Disease Immunological Complaints and Symptoms: Negative for: Hives; Itching Medical  History: Negative for: Lupus Erythematosus; Raynaudos; Scleroderma Integumentary (Skin) Complaints and Symptoms: Positive for: Wounds Negative for: Bleeding or bruising tendency; Breakdown; Swelling Medical History: Negative for: History of Burn; History of pressure wounds Musculoskeletal Complaints and Symptoms: Negative for: Muscle Pain; Muscle Weakness Medical History: Negative for: Gout; Rheumatoid Arthritis; Osteoarthritis; Osteomyelitis Neurologic Complaints and Symptoms: Negative for: Numbness/parasthesias; Focal/Weakness Medical History: Negative for: Dementia; Neuropathy; Quadriplegia; Paraplegia; Seizure Disorder Psychiatric Complaints and Symptoms: Negative for: Anxiety; Claustrophobia Medical History: Negative for: Anorexia/bulimia; Confinement Anxiety Oncologic Medical History: Negative for: Received Chemotherapy; Received Radiation Immunizations Pneumococcal Vaccine: Received Pneumococcal Vaccination: No Implantable Devices Yes Family and Social History [name], [name] ([id number]) Never smoker; Marital Status - Single; Alcohol Use: Never; Drug Use: No History; Caffeine Use: Never; Financial Concerns: No; Food, Clothing or Shelter Needs: No; Support System Lacking: No; Transportation Concerns: No Electronic Signature(s) Signed: 08/31/2020 8:16:57 AM By: Linton Ham MD Signed: 09/08/2020 8:11:40 AM By: Carlene Coria RN Entered By: Carlene Coria on 08/30/2020 10:18:51 [name], [name] ([id number]) -------------------------------------------------------------------------------- SuperBill Details Patient Name: [name] Date of Service: 08/30/2020 Medical Record Number: [id number] Patient Account Number: [id number] Date of Birth/Sex: 12/30/1990 (30 y.o. M) Treating RN: Cornell Barman Primary Care Provider: Derinda Late Other Clinician: Jeanine Luz Referring Provider: Derinda Late Treating Provider/Extender: Tito Dine in  Treatment: 0 Diagnosis Coding ICD-10 Codes Code Description [phone] Pressure ulcer of sacral region, stage 4 E43 Unspecified severe protein-calorie malnutrition G80.9 Cerebral palsy, unspecified Facility  Procedures CPT4 Code: 61164353 Description: 91225 - WOUND CARE VISIT-LEV 3 EST PT Modifier: Quantity: 1 CPT4 Code: 83462194 Description: 71252 - DEB SUBQ TISSUE 20 SQ CM/< Modifier: Quantity: 1 CPT4 Code: Description: ICD-10 Diagnosis Description L89.154 Pressure ulcer of sacral region, stage 4 Modifier: Quantity: Physician Procedures CPT4 Code: 7129290 Description: WC PHYS LEVEL 3 o NEW PT Modifier: 25 Quantity: 1 CPT4 Code: Description: ICD-10 Diagnosis Description L89.154 Pressure ulcer of sacral region, stage 4 E43 Unspecified severe protein-calorie malnutrition G80.9 Cerebral palsy, unspecified Modifier: Quantity: CPT4 Code: 9030149 Description: 11042 - WC PHYS SUBQ TISS 20 SQ CM Modifier: Quantity: 1 CPT4 Code: Description: ICD-10 Diagnosis Description L89.154 Pressure ulcer of sacral region, stage 4 Modifier: Quantity: Electronic Signature(s) Signed: 08/31/2020 8:16:57 AM By: Linton Ham MD Entered By: Linton Ham on 08/30/2020 11:44:33

## 2020-09-11 ENCOUNTER — Other Ambulatory Visit: Payer: No Typology Code available for payment source | Admitting: Primary Care

## 2020-09-11 ENCOUNTER — Other Ambulatory Visit: Payer: Self-pay

## 2020-09-11 DIAGNOSIS — E43 Unspecified severe protein-calorie malnutrition: Secondary | ICD-10-CM

## 2020-09-11 DIAGNOSIS — Z515 Encounter for palliative care: Secondary | ICD-10-CM

## 2020-09-11 DIAGNOSIS — L89150 Pressure ulcer of sacral region, unstageable: Secondary | ICD-10-CM

## 2020-09-11 DIAGNOSIS — J690 Pneumonitis due to inhalation of food and vomit: Secondary | ICD-10-CM

## 2020-09-11 DIAGNOSIS — E119 Type 2 diabetes mellitus without complications: Secondary | ICD-10-CM

## 2020-09-11 NOTE — Progress Notes (Signed)
[facility] Consult Note Telephone: [phone]  Fax: [phone]    Date of encounter: 09/11/20 PATIENT NAME: [name] [address] [place] [address]   [phone] (home)  DOB: Oct 19, 1990 MRN: [id number] PRIMARY CARE PROVIDER:    Derinda Late, MD,  [facility] [place] [address] [phone]  REFERRING PROVIDER:   Derinda Late, MD [phone] [address] [facility] [place],  [place] [address] [phone]  RESPONSIBLE PARTY:    Contact Information    Name Relation Home Work Mobile   Metzger Father [phone]  [phone]   HAYLEN, SHELNUTT  [phone]  [phone]      I met face to face with patient and family in  home. Palliative Care was asked to follow this patient by consultation request of  Derinda Late, MD to address advance care planning and complex medical decision making. This is a follow up visit.                                   ASSESSMENT AND PLAN / RECOMMENDATIONS:   Advance Care Planning/Goals of Care: Goals include to maximize quality of life and symptom management. Our advance care planning conversation included a discussion about:     Exploration of personal, cultural or spiritual beliefs that might influence medical decisions   CODE STATUS: FULL   Symptom Management/Plan:  Nutrition: I met with family and patient in his home; he has continued to make improvements with bolus versus continuous tube feedings. He tolerates about 80 grams of protein a day mostly via tube feeds but also some po. We discussed the need for a good protein intake in the context of wound healing which also seems to be improving. Goal is to increase intake.   Wound: Wound management has seen patient twice now. MD has  done some debriding and family reports wound is healing well. I will not  visualize it today but patient is following frequently with wound clinic. We did discuss the cardinal prevention/healing  measures of incontinence management, reduction of pressure and good protein intake. On abx for pna which covers wound as well (levaquin and metronidazole)   Diarrhea: This is explosive;  parents report this is troublesome for dehydration as well as skin excoriation and loss of nutrients. We discussed d/c of all laxatives, which family had not yet done. They felt they should keep him on linzess due to his past history of constipation. Recommend d/c of all OTC laxatives.  I also suggested they find an over the counter pre or probiotic  eg Lactobacillus or similar powder to put in water and use peg. Also they could consider introduction of yogurt etc. to address gut flora loss. Father showed  me a picture of mustard yellow granular stool. If it does not resolve with above interventions,  I would refer back to GI for further work up.  Seizures: Family has witnessed twice. Refer back to neuro for f/u. Endorse they are giving anti seizure meds but increased stooling may be increasing transport and decreasing absorption.  Glucose: glucose regulation is doing very well. Patient has transitioned off of insulin for now family will continue to monitor. The labile blood sugars are not apparent as to etiology but continue to monitor.  Follow up Palliative Care Visit: Palliative care will continue to follow for  complex medical decision making, advance care planning, and clarification of goals. Return 5-6 weeks or prn.  I spent 60 minutes providing this consultation. More than 50% of the time in this consultation was spent in counseling and care coordination.  PPS: 40%  HOSPICE ELIGIBILITY/DIAGNOSIS: not at this time due to non concordant goals  Chief Complaint: diarrhea  HISTORY OF PRESENT ILLNESS:  [name] is a 30 y.o. year old male  with h/o CP, numerous autoimmune disorders,  DM2, tube feedings and recent diarrhea increase, sacral decubitus, stage 4 .   History obtained from review of EMR, discussion with primary team, and interview with family, facility staff/caregiver and/or [name]  I reviewed available labs, medications, imaging, studies and related documents from the EMR.  Records reviewed and summarized above.   ROS  General: NAD ENMT: endorses dysphagia Pulmonary: endorses cough, denies increased SOB Abdomen: endorses fair appetite, endorses diarrhea, endorses incontinence of bowel GU: denies dysuria, endorses incontinence of urine MSK:  Endorses weakness,  no falls reported Skin: endorses sacral wound Neurological: denies pain, denies insomnia Psych: Endorses positive mood Heme/lymph/immuno: denies bruises, abnormal bleeding  Physical Exam: Current and past weights: 51 lbs Constitutional: NAD General: frail appearing, thin EYES: anicteric sclera, lids intact, no discharge  ENMT: intact hearing, oral mucous membranes moist, dentition intact, signs to communicate CV: S1S2, RRR, no LE edema Pulmonary: LCTA all posterior fields, no increased work of breathing, + postrandial cough, room air Abdomen: intake per g tube and po, currently 840 ml,no ascites GU: deferred MSK: severe sarcopenia, contractures of  All extremities, non ambulatory Skin: warm and dry, no rashes or wounds on visible skin Neuro:  ++generalized weakness,  ++cognitive impairment Psych: non-anxious affect, developmental delays, oriented x 2 Hem/lymph/immuno: no widespread bruising  Thank you for the opportunity to participate in the care of [name]  The palliative care team will continue to follow. Please call our office at [phone] if we can be of additional assistance.   Jason Coop, NP , DNP, MPH, AGPCNP-BC, ACHPN  COVID-19 PATIENT SCREENING TOOL Asked and negative response unless otherwise noted:   Have you had symptoms of covid, tested positive  or been in contact with someone with symptoms/positive test in the past 5-10 days?

## 2020-09-13 ENCOUNTER — Other Ambulatory Visit: Payer: Self-pay

## 2020-09-13 ENCOUNTER — Encounter: Payer: PRIVATE HEALTH INSURANCE | Admitting: Internal Medicine

## 2020-09-13 DIAGNOSIS — E11622 Type 2 diabetes mellitus with other skin ulcer: Secondary | ICD-10-CM | POA: Diagnosis not present

## 2020-09-13 NOTE — Progress Notes (Signed)
[name], Joshua ([id number]) Visit Report for 09/13/2020 HPI Details Patient Name: [name], [name] Date of Service: 09/13/2020 1:45 PM Medical Record Number: [id number] Patient Account Number: [id number] Date of Birth/Sex: September 28, 1990 (29 y.o. M) Treating RN: [name] Primary Care Provider: Derinda [name] Other Clinician: Jeanine [name] Referring Provider: Derinda [name] Treating Provider/Extender: [name] in Treatment: 2 History of Present Illness HPI Description: ADMISSION 08/30/2020 This is a difficult case of a unfortunate 30 year old man with cerebral palsy and seizures. 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 This caused quite a deterioration recently in his function he required placement of a PEG tube for feeding, he developed a significant left upper lobe aspiration driven lung met lung abscess for which she is taken Augmentin. He is nonambulatory. Nevertheless his parents were here today feel that he had ability to communicate and a good functional level up until very recently. He was hospitalized at the beginning of March for the pneumonia. During this time he was put in one of the hospital standard pressure prevention mattresses although this was apparently meant for an adult resulting in him sliding down and it and he developed a pressure sore on his lower sacrum. 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 They have been using Santyl. Apparently the nurse practitioner for palliative care who comes to see him in the home prescribed this Past medical history includes cerebral palsy with seizures, autoimmune encephalitis. He is a diabetic most  recently listed in the [facility] records as type I based on low protein C levels and an anti-GAD antibody however his parents state that this has recently been called into question however I did not delve into this further. He apparently has some form of ulcerative colitis, history of candidemia, nephrolithiasis, he is tube feed dependent although they still give him something orally. Left upper lobe lung abscess followed by ID at [facility] currently on Augmentin 4/6 culture I did last week showed staph lugdunensis. X-ray did not show osteomyelitis was a poor quality film they recommended a CT scan. They have been using Santyl-based dressings. Also note that they changed the antibiotics for the lung abscess to Levaquin and Flagyl from Augmentin. The Levaquin will indeed cover the coag negative staph we cultured. He does have exposed bone however I do not feel pressured to do a CT scan at this point. It would be possible to biopsy the bone should that become necessary 4/13; patient arrives in clinic still using Santyl to his wounds. His parents report up to 9 liquid bowel movements a day over the last 5 to 6 days. They go on to tell me that he is being worked up for a small bowel source of diarrhea. He does apparently not have celiac disease. There has been some thought about malabsorption. He had colonoscopy and endoscopy. Currently receiving continuous tube feed Electronic Signature(s) Signed: 09/13/2020 5:03:12 PM By: Joshua Ham MD Entered By: [name] on 09/13/2020 14:56:30 [name], [name] ([id number]) -------------------------------------------------------------------------------- Physical Exam Details Patient Name: [name] Date of Service: 09/13/2020 1:45 PM Medical Record Number: [id number] Patient Account Number: [id number] Date of Birth/Sex: 1990/12/24 (29 y.o. M) Treating RN: Joshua Coder,  Minus [name] Primary Care Provider: Derinda [name] Other Clinician: Jeanine [name] Referring Provider: Derinda [name] Treating Provider/Extender: [name] in Treatment: 2 Constitutional Sitting or standing Blood Pressure is within target range for patient.. Pulse regular and within target range for patient.Marland Kitchen Respirations regular, non- labored and within target range.. Temperature is normal and within the target range for the patient.Marland Kitchen appears in no distress. Gastrointestinal (GI) Abdomen is soft nontender no distention. Notes Wound exam; lower sacrum/coccyx. This is a stage IV wound with still exposed bone obvious. With debridement and Santyl the surrounding granulation tissue actually looks fairly healthy. I do not know whether this can be stimulated enough to ever heal this wound or cover the bone but were close to starting a collagen-based dressing perhaps the next time we see him there is no evidence of infection here currently. Electronic Signature(s) Signed: 09/13/2020 5:03:12 PM By: Joshua Ham MD Entered By: [name] on 09/13/2020 14:59:22 [name], [name] ([id number]) -------------------------------------------------------------------------------- Physician Orders Details Patient Name: [name] Date of Service: 09/13/2020 1:45 PM Medical Record Number: [id number] Patient Account Number: [id number] Date of Birth/Sex: 06-Jun-1990 (29 y.o. M) Treating RN: [name] Primary Care Provider: Derinda [name] Other Clinician: Jeanine [name] Referring Provider: Derinda [name] Treating Provider/Extender: [name] in Treatment: 2 Verbal / Phone Orders: No Diagnosis Coding Follow-up Appointments o Return Appointment in 2 weeks. Bathing/ Shower/ Hygiene o Clean wound with Normal Saline or wound cleanser. Off-Loading o Turn and reposition every 2 hours o Other: - Keep all pressure of of wounded area Wound Treatment Wound #1 - Sacrum Cleanser: Normal Saline 1 x Per Day/30 Days Discharge  Instructions: Wash your hands with soap and water. Remove old dressing, discard into plastic bag and place into trash. Cleanse the wound with Normal Saline prior to applying a clean dressing using gauze sponges, not tissues or cotton balls. Do not scrub or use excessive force. Pat dry using gauze sponges, not tissue or cotton balls. Topical: Santyl Collagenase Ointment, 30 (gm), tube 1 x Per Day/30 Days Discharge Instructions: apply nickel thick to wound bed only Secondary Dressing: Mepilex Border Flex, 4x4 (in/in) 1 x Per Day/30 Days Discharge Instructions: Apply to wound as directed. Do not cut. Electronic Signature(s) Signed: 09/13/2020 4:47:42 PM By: Georges Mouse, Minus Breeding RN Signed: 09/13/2020 5:03:12 PM By: Joshua Ham MD Entered By: Georges Mouse, Minus [name] on 09/13/2020 14:50:53 Woolum, Natanel D. ([id number]) -------------------------------------------------------------------------------- Problem List Details Patient Name: [name] Date of Service: 09/13/2020 1:45 PM Medical Record Number: [id number] Patient Account Number: [id number] Date of Birth/Sex: [birth year] (29 y.o. M) Treating RN: [name] Primary Care Provider: Derinda [name] Other Clinician: Jeanine [name] Referring Provider: Derinda [name] Treating Provider/Extender: [name] in Treatment: 2 Active Problems ICD-10 Encounter Code Description Active Date MDM Diagnosis L89.154 Pressure ulcer of sacral region, stage 4 08/30/2020 No Yes E43 Unspecified severe protein-calorie malnutrition 08/30/2020 No Yes G80.9 Cerebral palsy, unspecified 08/30/2020 No Yes Inactive Problems Resolved Problems Electronic Signature(s) Signed: 09/13/2020 5:03:12 PM By: Joshua Ham MD Entered By: [name] on 09/13/2020 14:54:27 Abts, Iris D. ([id number]) -------------------------------------------------------------------------------- Progress Note Details Patient Name: [name] Date of Service: 09/13/2020 1:45 PM Medical Record Number: [id number] Patient Account Number: [id number] Date of Birth/Sex: 09/12/1990 (29 y.o. M) Treating RN: [name] Primary Care Provider: Derinda [name] Other Clinician: Jeanine [name] Referring Provider: Derinda [name] Treating Provider/Extender: [name] in Treatment: 2 Subjective History of Present Illness (HPI) ADMISSION 08/30/2020 This is a difficult case  of a unfortunate 30 year old man with cerebral palsy and seizures. 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 This caused quite a deterioration recently in his function he required placement of a PEG tube for feeding, he developed a significant left upper lobe aspiration driven lung met lung abscess for which she is taken Augmentin. He is nonambulatory. Nevertheless his parents were here today feel that he had ability to communicate and a good functional level up until very recently. He was hospitalized at the beginning of March for the pneumonia. During this time he was put in one of the hospital standard pressure prevention mattresses although this was apparently meant for an adult resulting in him sliding down and it and he developed a pressure sore on his lower sacrum. 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 They have been using Santyl. Apparently the nurse practitioner for palliative care who comes to see him in the home prescribed this Past medical history includes cerebral palsy with seizures, autoimmune encephalitis. He is a diabetic most recently listed in the [facility] records as type I based on low protein C levels and an anti-GAD antibody  however his parents state that this has recently been called into question however I did not delve into this further. He apparently has some form of ulcerative colitis, history of candidemia, nephrolithiasis, he is tube feed dependent although they still give him something orally. Left upper lobe lung abscess followed by ID at [facility] currently on Augmentin 4/6 culture I did last week showed staph lugdunensis. X-ray did not show osteomyelitis was a poor quality film they recommended a CT scan. They have been using Santyl-based dressings. Also note that they changed the antibiotics for the lung abscess to Levaquin and Flagyl from Augmentin. The Levaquin will indeed cover the coag negative staph we cultured. He does have exposed bone however I do not feel pressured to do a CT scan at this point. It would be possible to biopsy the bone should that become necessary 4/13; patient arrives in clinic still using Santyl to his wounds. His parents report up to 9 liquid bowel movements a day over the last 5 to 6 days. They go on to tell me that he is being worked up for a small bowel source of diarrhea. He does apparently not have celiac disease. There has been some thought about malabsorption. He had colonoscopy and endoscopy. Currently receiving continuous tube feed Objective Constitutional Sitting or standing Blood Pressure is within target range for patient.. Pulse regular and within target range for patient.Marland Kitchen Respirations regular, non- labored and within target range.. Temperature is normal and within the target range for the patient.Marland Kitchen appears in no distress. Vitals Time Taken: 2:57 PM, Height: 54 in, Weight: 50 lbs, BMI: 12.1, Pulse: 91 bpm, Respiratory Rate: 16 breaths/min, Blood Pressure: 111/76 mmHg. Gastrointestinal (GI) Abdomen is soft nontender no distention. General Notes: Wound exam; lower sacrum/coccyx. This is a stage IV wound with still exposed bone obvious. With debridement and Santyl  the surrounding granulation tissue actually looks fairly healthy. I do not know whether this can be stimulated enough to ever heal this wound or cover the bone but were close to  starting a collagen-based dressing perhaps the next time we see him there is no evidence of infection here currently. Integumentary (Hair, Skin) Wound #1 status is Open. Original cause of wound was Gradually Appeared. The date acquired was: 08/04/2020. The wound has been in treatment 2 weeks. The wound is located on the Sacrum. The wound measures 0.6cm length x 0.4cm width x 0.7cm depth; 0.188cm^2 area and 0.132cm^3 volume. There is Fat Layer (Subcutaneous Tissue) exposed. There is undermining starting at 9:00 and ending at 12:00 with a maximum distance of 2.2cm. There is a medium amount of serous drainage noted. There is small (1-33%) pink granulation within the wound bed. There is a large (67-100%) amount of necrotic tissue within the wound bed including Adherent Slough. Putman, Irie D. ([id number]) Assessment Active Problems ICD-10 Pressure ulcer of sacral region, stage 4 Unspecified severe protein-calorie malnutrition Cerebral palsy, unspecified Plan Follow-up Appointments: Return Appointment in 2 weeks. Bathing/ Shower/ Hygiene: Clean wound with Normal Saline or wound cleanser. Off-Loading: Turn and reposition every 2 hours Other: - Keep all pressure of of wounded area WOUND #1: - Sacrum Wound Laterality: Cleanser: Normal Saline 1 x Per Day/30 Days Discharge Instructions: Wash your hands with soap and water. Remove old dressing, discard into plastic bag and place into trash. Cleanse the wound with Normal Saline prior to applying a clean dressing using gauze sponges, not tissues or cotton balls. Do not scrub or use excessive force. Pat dry using gauze sponges, not tissue or cotton balls. Topical: Santyl Collagenase Ointment, 30 (gm), tube 1 x Per Day/30 Days Discharge Instructions: apply nickel thick to  wound bed only Secondary Dressing: Mepilex Border Flex, 4x4 (in/in) 1 x Per Day/30 Days Discharge Instructions: Apply to wound as directed. Do not cut. 1. I am going to continue the Santyl for another 2 weeks. Change to collagen-based dressings after that. I am uncertain whether he is eligible for an advanced treatment product. 2. Could consider trying a snap VAC on this area as well. All of this of course insurance pending 3. No evidence of infection in the wound I am looking at 4. I told the parents that he would be high risk scenario for pseudomembranous colitis. I told him to call their primary doctor and see if they can direct him to provide a specimen for C. difficile toxin assay 5. Sounds also that he has chronic diarrhea being worked up for malabsorption or at least the thought of it. Electronic Signature(s) Signed: 09/13/2020 5:03:12 PM By: Joshua Ham MD Entered By: [name] on 09/13/2020 15:01:29 Oriley, Roye D. ([id number]) -------------------------------------------------------------------------------- SuperBill Details Patient Name: [name] Date of Service: 09/13/2020 Medical Record Number: [id number] Patient Account Number: [id number] Date of Birth/Sex: Jan 01, 1991 (29 y.o. M) Treating RN: [name] Primary Care Provider: Derinda [name] Other Clinician: Jeanine [name] Referring Provider: Derinda [name] Treating Provider/Extender: [name] in Treatment: 2 Diagnosis Coding ICD-10 Codes Code Description [phone] Pressure ulcer of sacral region, stage 4 E43 Unspecified severe protein-calorie malnutrition G80.9 Cerebral palsy, unspecified Facility Procedures CPT4 Code: [id number] Description: 99213 - WOUND CARE VISIT-LEV 3 EST PT Modifier: Quantity: 1 Physician Procedures CPT4 Code: 0076226 Description: 33354 - WC PHYS LEVEL 3 - EST PT Modifier: Quantity: 1 CPT4 Code: Description: ICD-10 Diagnosis Description L89.154  Pressure ulcer of sacral region, stage 4 E43 Unspecified severe protein-calorie malnutrition Modifier: Quantity: Electronic Signature(s) Signed: 09/13/2020 5:03:12 PM By: Joshua Ham MD Entered By: [name] on 09/13/2020 15:01:48

## 2020-09-14 NOTE — Progress Notes (Signed)
Joshua, [name] ([id number]) Visit Report for 09/13/2020 Arrival Information Details Patient Name: [name], [name] Date of Service: 09/13/2020 1:45 PM Medical Record Number: [id number] Patient Account Number: [id number] Date of Birth/Sex: 06/28/1990 (29 y.o. M) Treating [name]: [name] Primary Care [name]: [name] Other Clinician: Lolita [name] Referring [name]: [name] Treating [name]/Extender: [name] in Treatment: 2 Visit Information History Since Last Visit Added or deleted any medications: No Patient Arrived: Wheel Chair Had a fall or experienced change in No Arrival Time: 13:45 activities of daily living that may affect Accompanied By: parents risk of falls: Transfer Assistance: Manual Hospitalized since last visit: No Patient Identification Verified: Yes Pain Present Now: Unable to Respond Secondary Verification Process Completed: Yes Patient Requires Transmission-Based Precautions: No Patient Has Alerts: No Electronic Signature(s) Signed: 09/13/2020 4:32:52 PM By: [name] Entered By: [name] on 09/13/2020 13:47:24 [name], [name] ([id number]) -------------------------------------------------------------------------------- Clinic Level of Care Assessment Details Patient Name: [name] Date of Service: 09/13/2020 1:45 PM Medical Record Number: [id number] Patient Account Number: [id number] Date of Birth/Sex: [birth year] (29 y.o. M) Treating [name]: [name] Primary Care [name]: [name] Other Clinician: Lolita [name] Referring [name]: [name] Treating [name]/Extender: [name] in Treatment: 2 Clinic Level of Care Assessment Items TOOL 4 Quantity Score X - Use when only an EandM is performed on FOLLOW-UP visit 1 0 ASSESSMENTS - Nursing Assessment / Reassessment X - Reassessment of Co-morbidities (includes updates in patient status) 1 10 X- 1  5 Reassessment of Adherence to Treatment Plan ASSESSMENTS - Wound and Skin Assessment / Reassessment X - Simple Wound Assessment / Reassessment - one wound 1 5 []  - 0 Complex Wound Assessment / Reassessment - multiple wounds []  - 0 Dermatologic / Skin Assessment (not related to wound area) ASSESSMENTS - Focused Assessment []  - Circumferential Edema Measurements - multi extremities 0 []  - 0 Nutritional Assessment / Counseling / Intervention []  - 0 Lower Extremity Assessment (monofilament, tuning fork, pulses) []  - 0 Peripheral Arterial Disease Assessment (using hand held doppler) ASSESSMENTS - Ostomy and/or Continence Assessment and Care []  - Incontinence Assessment and Management 0 []  - 0 Ostomy Care Assessment and Management (repouching, etc.) PROCESS - Coordination of Care X - Simple Patient / Family Education for ongoing care 1 15 []  - 0 Complex (extensive) Patient / Family Education for ongoing care []  - 0 Staff obtains , Records, Test Results / Process Orders []  - 0 Staff telephones HHA, Nursing Homes / Clarify orders / etc []  - 0 Routine Transfer to another Facility (non-emergent condition) []  - 0 Routine Hospital Admission (non-emergent condition) []  - 0 New Admissions / / Ordering NPWT, Apligraf, etc. []  - 0 Emergency Hospital Admission (emergent condition) X- 1 10 Simple Discharge Coordination []  - 0 Complex (extensive) Discharge Coordination PROCESS - Special Needs []  - Pediatric / Minor Patient Management 0 []  - 0 Isolation Patient Management []  - 0 Hearing / Language / Visual special needs []  - 0 Assessment of Community assistance (transportation, [name], etc.) []  - 0 Additional assistance / Altered mentation []  - 0 Support Surface(s) Assessment (bed, cushion, seat, etc.) INTERVENTIONS - Wound Cleansing / Measurement [name] ( ) X- 1 5 Simple Wound Cleansing - one wound []  - 0 Complex  Wound Cleansing - multiple wounds X- 1 5 Wound Imaging (photographs - any number of wounds) []  - 0 Wound Tracing (instead of photographs) X- 1 5 Simple Wound Measurement - one wound []  - 0  Complex Wound Measurement - multiple wounds INTERVENTIONS - Wound Dressings []  - Small Wound Dressing one or multiple wounds 0 X- 1 15 Medium Wound Dressing one or multiple wounds []  - 0 Large Wound Dressing one or multiple wounds X- 1 5 Application of Medications - topical []  - 0 Application of Medications - injection INTERVENTIONS - Miscellaneous []  - External ear exam 0 []  - 0 Specimen Collection (cultures, biopsies, blood, body fluids, etc.) []  - 0 Specimen(s) / Culture(s) sent or taken to Lab for analysis []  - 0 Patient Transfer (multiple staff / Nurse, adultHoyer Lift / Similar devices) []  - 0 Simple Staple / Suture removal (25 or less) []  - 0 Complex Staple / Suture removal (26 or more) []  - 0 Hypo / Hyperglycemic Management (close monitor of Blood Glucose) []  - 0 Ankle / Brachial Index (ABI) - do not check if billed separately X- 1 5 Vital Signs Has the patient been seen at the hospital within the last three years: Yes Total Score: 85 Level Of Care: New/Established - Level 3 Electronic Signature(s) Signed: 09/13/2020 4:47:42 PM By: [name], [name] Entered By: [name], Joshua PraderKenia on 09/13/2020 14:44:56 [name], [name] ([id number]) -------------------------------------------------------------------------------- Encounter Discharge Information Details Patient Name: Joshua HoraMOREFIELD, [name] Date of Service: 09/13/2020 1:45 PM Medical Record Number: [id number] Patient Account Number: [id number] Date of Birth/Sex: 1990-06-16 (29 y.o. M) Treating [name]: Yevonne PaxEpps, Carrie Primary Care Karliah Kowalchuk: Joshua RudBabaoff, Marcus Other Clinician: Lolita CramBurnette, Kyara Referring Adelyn Roscher: Joshua RudBabaoff, Marcus Treating Akito Boomhower/Extender: Joshua CarolinaOBSON, MICHAEL G Weeks in Treatment: 2 Encounter Discharge Information  Items Discharge Condition: Stable Ambulatory Status: Wheelchair Discharge Destination: Home Transportation: Private Auto Accompanied By: self Schedule Follow-up Appointment: Yes Clinical Summary of Care: Electronic Signature(s) Signed: 09/14/2020 10:06:31 AM By: Yevonne PaxEpps, Carrie [name] Entered By: Yevonne PaxEpps, Carrie on 09/13/2020 14:59:03 Racette, [name] ([id number]) -------------------------------------------------------------------------------- Lower Extremity Assessment Details Patient Name: Joshua HoraMOREFIELD, [name] Date of Service: 09/13/2020 1:45 PM Medical Record Number: [id number] Patient Account Number: [id number] Date of Birth/Sex: 1990-06-16 (29 y.o. M) Treating [name]: Joshua BlockerSanchez, Kenia Primary Care Jaiel Saraceno: Joshua RudBabaoff, Marcus Other Clinician: Lolita CramBurnette, Kyara Referring Tamsen Reist: Joshua RudBabaoff, Marcus Treating Talynn Lebon/Extender: Joshua CarolinaOBSON, MICHAEL G Weeks in Treatment: 2 Electronic Signature(s) Signed: 09/13/2020 4:32:52 PM By: Joshua CramBurnette, Kyara Signed: 09/13/2020 4:47:42 PM By: [name], [name] Entered By: Joshua CramBurnette, Kyara on 09/13/2020 13:48:39 [place]on, Cleven [name]. ([id number]) -------------------------------------------------------------------------------- Multi Wound Chart Details Patient Name: Berenice PrimasMOREFIELD, [name] Date of Service: 09/13/2020 1:45 PM Medical Record Number: [id number] Patient Account Number: [id number] Date of Birth/Sex: 1990-06-16 (29 y.o. M) Treating [name]: Joshua BlockerSanchez, Kenia Primary Care Skylyn Slezak: Joshua RudBabaoff, Marcus Other Clinician: Lolita CramBurnette, Kyara Referring Rozann Holts: Joshua RudBabaoff, Marcus Treating Jisselle Poth/Extender: Joshua CarolinaOBSON, MICHAEL G Weeks in Treatment: 2 Vital Signs Height(in): 54 Pulse(bpm): 91 Weight(lbs): 50 Blood Pressure(mmHg): 111/76 Body Mass Index(BMI): 12 Temperature(F): Respiratory Rate(breaths/min): 16 Photos: [N/A:N/A] Wound Location: Sacrum N/A N/A Wounding Event: Gradually Appeared N/A N/A Primary Etiology: Pressure Ulcer N/A N/A Comorbid History: Type II  Diabetes N/A N/A Date Acquired: 08/04/2020 N/A N/A Weeks of Treatment: 2 N/A N/A Wound Status: Open N/A N/A Measurements L x W x [name] (cm) 0.6x0.4x0.7 N/A N/A Area (cm) : 0.188 N/A N/A Volume (cm) : 0.132 N/A N/A % Reduction in Area: 91.50% N/A N/A % Reduction in Volume: 95.00% N/A N/A Starting Position 1 (o'clock): 9 Ending Position 1 (o'clock): 12 Maximum Distance 1 (cm): 2.2 Undermining: Yes N/A N/A Classification: Category/Stage IV N/A N/A Exudate Amount: Medium N/A N/A Exudate Type: Serous N/A N/A Exudate Color: amber N/A N/A Granulation Amount: Small (1-33%) N/A N/A Granulation Quality: Pink N/A N/A  Necrotic Amount: Large (67-100%) N/A N/A Exposed Structures: Fat Layer (Subcutaneous Tissue): N/A N/A Yes Fascia: No Tendon: No Muscle: No Joint: No Bone: No Epithelialization: None N/A N/A Treatment Notes Electronic Signature(s) Signed: 09/13/2020 5:03:12 PM By: Baltazar Najjar MD Entered By: Baltazar Najjar on 09/13/2020 14:54:41 [name], [name] ([id number]) -------------------------------------------------------------------------------- Multi-Disciplinary Care Plan Details Patient Name: [name] Date of Service: 09/13/2020 1:45 PM Medical Record Number: [id number] Patient Account Number: [id number] Date of Birth/Sex: [birth year] (29 y.o. M) Treating [name]: [name] Primary Care Yuniel Blaney: [name] Other Clinician: Lolita [name] Referring Jalea Bronaugh: [name] Treating Lexy Meininger/Extender: Joshua [place] in Treatment: 2 Active Inactive Necrotic Tissue Nursing Diagnoses: Impaired tissue integrity related to necrotic/devitalized tissue Goals: Necrotic/devitalized tissue will be minimized in the wound bed Date Initiated: 08/30/2020 Target Resolution Date: 09/06/2020 Goal Status: Active Interventions: Assess patient pain level pre-, during and post procedure and prior to discharge Treatment Activities: Enzymatic debridement :  08/30/2020 Notes: Pressure Nursing Diagnoses: Knowledge deficit related to management of pressures ulcers Potential for impaired tissue integrity related to pressure, friction, moisture, and shear Goals: Patient will remain free from development of additional pressure ulcers Date Initiated: 08/30/2020 Target Resolution Date: 09/20/2020 Goal Status: Active Patient/caregiver will verbalize understanding of pressure ulcer management Date Initiated: 08/30/2020 Target Resolution Date: 09/20/2020 Goal Status: Active Interventions: Provide education on pressure ulcers Notes: Wound/Skin Impairment Nursing Diagnoses: Knowledge deficit related to ulceration/compromised skin integrity Goals: Patient/caregiver will verbalize understanding of skin care regimen Date Initiated: 08/30/2020 Target Resolution Date: 09/06/2020 Goal Status: Active Ulcer/skin breakdown will have a volume reduction of 30% by week 4 Date Initiated: 08/30/2020 Target Resolution Date: 09/30/2020 Goal Status: Active Interventions: Assess ulceration(s) every visit Provide education on ulcer and skin care [place], [name] ([id number]) Treatment Activities: Referred to DME Macauley Mossberg for dressing supplies : 08/30/2020 Skin care regimen initiated : 08/30/2020 Topical wound management initiated : 08/30/2020 Notes: Electronic Signature(s) Signed: 09/13/2020 4:47:42 PM By: Joshua Haggis, Joshua Prader [name] Entered By: Joshua Haggis, Joshua Prader on 09/13/2020 14:36:32 Hise, [name] ([id number]) -------------------------------------------------------------------------------- Pain Assessment Details Patient Name: [name] Date of Service: 09/13/2020 1:45 PM Medical Record Number: [id number] Patient Account Number: [id number] Date of Birth/Sex: 11/26/1990 (29 y.o. M) Treating [name]: [name] Primary Care Yong Grieser: [name] Other Clinician: Lolita [name] Referring Floree Zuniga: [name] Treating  Zhaniya Swallows/Extender: Joshua [place] in Treatment: 2 Active Problems Location of Pain Severity and Description of Pain Patient Has Paino Patient Unable to Respond Site Locations Pain Management and Medication Current Pain Management: Electronic Signature(s) Signed: 09/13/2020 4:32:52 PM By: [name] Signed: 09/13/2020 4:47:42 PM By: Joshua Haggis, Joshua Prader [name] Entered By: [name] on 09/13/2020 13:48:27 Dupin, [name] ([id number]) -------------------------------------------------------------------------------- Patient/Caregiver Education Details Patient Name: [name] Date of Service: 09/13/2020 1:45 PM Medical Record Number: [id number] Patient Account Number: [id number] Date of Birth/Gender: 01-Sep-1990 (29 y.o. M) Treating [name]: [name] Primary Care Physician: [name] Other Clinician: Lolita [name] Referring Physician: Kandyce [name] Treating Physician/Extender: Joshua [place] in Treatment: 2 Education Assessment Education Provided To: Patient Education Topics Provided Wound/Skin Impairment: Methods: Explain/Verbal Responses: State content correctly Electronic Signature(s) Signed: 09/13/2020 4:47:42 PM By: Joshua Haggis, Joshua Prader [name] Entered By: Joshua Haggis, Joshua Prader on 09/13/2020 14:45:16 [name], [name] ([id number]) -------------------------------------------------------------------------------- Wound Assessment Details Patient Name: [name] Date of Service: 09/13/2020 1:45 PM Medical Record Number: [id number] Patient Account Number: [id number] Date of Birth/Sex: [birth year] (29 y.o. M) Treating [name]: [name] Primary Care Tyrie Porzio: [name] Other Clinician: Lolita [name] Referring  Diane Mochizuki: [name] Treating Layton Tappan/Extender: Joshua [place] in Treatment: 2 Wound Status Wound Number: 1 Primary Etiology: Pressure Ulcer Wound Location: Sacrum Wound Status:  Open Wounding Event: Gradually Appeared Comorbid History: Type II Diabetes Date Acquired: 08/04/2020 Weeks Of Treatment: 2 Clustered Wound: No Photos Wound Measurements Length: (cm) 0.6 % Reduct Width: (cm) 0.4 % Reduct Depth: (cm) 0.7 Epitheli Area: (cm) 0.188 Undermi Volume: (cm) 0.132 Star Endin Maxim ion in Area: 91.5% ion in Volume: 95% alization: None ning: Yes ting Position (o'clock): 9 g Position (o'clock): 12 um Distance: (cm) 2.2 Wound Description Classification: Category/Stage IV Foul Odo Exudate Amount: Medium Slough/F Exudate Type: Serous Exudate Color: amber r After Cleansing: No ibrino Yes Wound Bed Granulation Amount: Small (1-33%) Exposed Structure Granulation Quality: Pink Fascia Exposed: No Necrotic Amount: Large (67-100%) Fat Layer (Subcutaneous Tissue) Exposed: Yes Necrotic Quality: Adherent Slough Tendon Exposed: No Muscle Exposed: No Joint Exposed: No Bone Exposed: No Treatment Notes Wound #1 (Sacrum) Cleanser Normal Saline [name], [name] ([id number]) Discharge Instruction: Wash your hands with soap and water. Remove old dressing, discard into plastic bag and place into trash. Cleanse the wound with Normal Saline prior to applying a clean dressing using gauze sponges, not tissues or cotton balls. Do not scrub or use excessive force. Pat dry using gauze sponges, not tissue or cotton balls. Peri-Wound Care Topical Santyl Collagenase Ointment, 30 (gm), tube Discharge Instruction: apply nickel thick to wound bed only Primary Dressing Secondary Dressing Mepilex Border Flex, 4x4 (in/in) Discharge Instruction: Apply to wound as directed. Do not cut. Secured With Compression Wrap Compression Stockings Facilities manager) Signed: 09/13/2020 4:32:52 PM By: [name] Signed: 09/13/2020 4:47:42 PM By: Joshua Haggis, Joshua Prader [name] Entered By: [name] on 09/13/2020 13:57:21 Aron, [name]  ([id number]) -------------------------------------------------------------------------------- Vitals Details Patient Name: [name] Date of Service: 09/13/2020 1:45 PM Medical Record Number: [id number] Patient Account Number: [id number] Date of Birth/Sex: 12/07/1990 (29 y.o. M) Treating [name]: [name] Primary Care Violett Hobbs: [name] Other Clinician: Lolita [name] Referring Barry Culverhouse: [name] Treating Toy Samarin/Extender: Joshua [place] in Treatment: 2 Vital Signs Time Taken: 14:57 Pulse (bpm): 91 Height (in): 54 Respiratory Rate (breaths/min): 16 Weight (lbs): 50 Blood Pressure (mmHg): 111/76 Body Mass Index (BMI): 12.1 Reference Range: 80 - 120 mg / dl Electronic Signature(s) Signed: 09/13/2020 4:32:52 PM By: [name] Entered By: [name] on 09/13/2020 13:48:19

## 2020-09-27 ENCOUNTER — Other Ambulatory Visit: Payer: Self-pay

## 2020-09-27 ENCOUNTER — Encounter: Payer: PRIVATE HEALTH INSURANCE | Admitting: Internal Medicine

## 2020-09-27 DIAGNOSIS — E11622 Type 2 diabetes mellitus with other skin ulcer: Secondary | ICD-10-CM | POA: Diagnosis not present

## 2020-09-27 NOTE — Progress Notes (Signed)
[name], [name] ([id number]) Visit Report for 09/27/2020 Arrival Information Details Patient Name: [name], [name] Date of Service: 09/27/2020 11:00 AM Medical Record Number: [id number] Patient Account Number: [id number] Date of Birth/Sex: 04-13-1991 (30 y.o. M) Treating RN: Huel Coventry Primary Care Marilee Ditommaso: Kandyce Rud Other Clinician: Lolita Cram Referring Matina Rodier: Kandyce Rud Treating Kurtis Anastasia/Extender: Altamese [place] in Treatment: 4 Visit Information History Since Last Visit Added or deleted any medications: Yes Patient Arrived: Wheel Chair Had a fall or experienced change in No Arrival Time: 11:11 activities of daily living that may affect Accompanied By: parents risk of falls: Transfer Assistance: Manual Hospitalized since last visit: No Patient Identification Verified: Yes Pain Present Now: No Secondary Verification Process Completed: Yes Patient Requires Transmission-Based Precautions: No Patient Has Alerts: No Electronic Signature(s) Signed: 09/27/2020 4:53:53 PM By: Lolita Cram Entered By: Lolita Cram on 09/27/2020 11:11:33 Lemar, Jett D. ([id number]) -------------------------------------------------------------------------------- Encounter Discharge Information Details Patient Name: [name] Date of Service: 09/27/2020 11:00 AM Medical Record Number: [id number] Patient Account Number: [id number] Date of Birth/Sex: 02/03/1991 (30 y.o. M) Treating RN: Huel Coventry Primary Care Coriann Brouhard: Kandyce Rud Other Clinician: Lolita Cram Referring Clover Feehan: Kandyce Rud Treating Camilah Spillman/Extender: Altamese [place] in Treatment: 4 Encounter Discharge Information Items Post Procedure Vitals Discharge Condition: Stable Pulse (bpm): 95 Ambulatory Status: Wheelchair Blood Pressure (mmHg): 125/85 Discharge Destination: Home Unable to obtain vitals Reason: . Transportation: Private Auto Accompanied By:  self Schedule Follow-up Appointment: Yes Clinical Summary of Care: Electronic Signature(s) Signed: 09/27/2020 4:08:37 PM By: Elliot Gurney, BSN, RN, CWS, Kim RN, BSN Entered By: Elliot Gurney, BSN, RN, CWS, Kim on 09/27/2020 11:52:38 Dorris, Duane Lope ([id number]) -------------------------------------------------------------------------------- Lower Extremity Assessment Details Patient Name: [name] Date of Service: 09/27/2020 11:00 AM Medical Record Number: [id number] Patient Account Number: [id number] Date of Birth/Sex: [birth year] (30 y.o. M) Treating RN: Huel Coventry Primary Care Makaylia Hewett: Kandyce Rud Other Clinician: Lolita Cram Referring Kasten Leveque: Kandyce Rud Treating Briyana Badman/Extender: Altamese [place] in Treatment: 4 Electronic Signature(s) Signed: 09/27/2020 4:08:37 PM By: Elliot Gurney BSN, RN, CWS, Kim RN, BSN Signed: 09/27/2020 4:53:53 PM By: Lolita Cram Entered By: Lolita Cram on 09/27/2020 11:21:22 Sones, Miachel D. ([id number]) -------------------------------------------------------------------------------- Multi Wound Chart Details Patient Name: [name] Date of Service: 09/27/2020 11:00 AM Medical Record Number: [id number] Patient Account Number: [id number] Date of Birth/Sex: 1990/07/25 (30 y.o. M) Treating RN: Huel Coventry Primary Care Demetrius Mahler: Kandyce Rud Other Clinician: Lolita Cram Referring Lovell Nuttall: Kandyce Rud Treating Ronnell Clinger/Extender: Altamese [place] in Treatment: 4 Vital Signs Height(in): 54 Pulse(bpm): 95 Weight(lbs): 50 Blood Pressure(mmHg): 125/85 Body Mass Index(BMI): 12 Temperature(F): Respiratory Rate(breaths/min): 16 Photos: [N/A:N/A] Wound Location: Sacrum N/A N/A Wounding Event: Gradually Appeared N/A N/A Primary Etiology: Pressure Ulcer N/A N/A Comorbid History: Type II Diabetes N/A N/A Date Acquired: 08/04/2020 N/A N/A Weeks of Treatment: 4 N/A N/A Wound Status: Open N/A  N/A Measurements L x W x D (cm) 0.6x0.3x0.8 N/A N/A Area (cm) : 0.141 N/A N/A Volume (cm) : 0.113 N/A N/A % Reduction in Area: 93.60% N/A N/A % Reduction in Volume: 95.70% N/A N/A Starting Position 1 (o'clock): 11 Ending Position 1 (o'clock): 2 Maximum Distance 1 (cm): 1 Undermining: Yes N/A N/A Classification: Category/Stage IV N/A N/A Exudate Amount: Medium N/A N/A Exudate Type: Serous N/A N/A Exudate Color: amber N/A N/A Granulation Amount: Small (1-33%) N/A N/A Granulation Quality: Pink N/A N/A Necrotic Amount: Large (67-100%) N/A N/A Exposed Structures: Fat Layer (Subcutaneous Tissue): N/A N/A Yes Fascia: No Tendon: No Muscle: No Joint: No Bone:  No Epithelialization: None N/A N/A Debridement: Debridement - Selective/Open N/A N/A Wound Level: Skin/Epidermis N/A N/A Debridement Area (sq cm): 0.18 N/A N/A Instrument: Forceps, Scissors N/A N/A Bleeding: Minimum N/A N/A Hemostasis Achieved: Pressure N/A N/A Debridement Treatment Procedure was tolerated well N/A N/A Response: Post Debridement 0.6x0.3x0.8 N/A N/A Measurements L x W x D (cm) Gaige, Taijuan D. ([id number]) Post Debridement Volume: 0.113 N/A N/A (cm) Post Debridement Stage: Category/Stage IV N/A N/A Procedures Performed: Debridement N/A N/A Treatment Notes Wound #1 (Sacrum) Cleanser Normal Saline Discharge Instruction: Wash your hands with soap and water. Remove old dressing, discard into plastic bag and place into trash. Cleanse the wound with Normal Saline prior to applying a clean dressing using gauze sponges, not tissues or cotton balls. Do not scrub or use excessive force. Pat dry using gauze sponges, not tissue or cotton balls. Peri-Wound Care Topical Primary Dressing Prisma 4.34 (in) Discharge Instruction: Moisten w/normal saline or sterile water; Cover wound as directed. Do not remove from wound bed. Secondary Dressing Mepilex Border Flex, 4x4 (in/in) Discharge Instruction: Apply to  wound as directed. Do not cut. Secured With Compression Wrap Compression Stockings Facilities managerAdd-Ons Electronic Signature(s) Signed: 09/27/2020 5:04:28 PM By: Baltazar Najjarobson, Michael MD Entered By: Baltazar Najjarobson, Michael on 09/27/2020 12:40:31 Yordy, Nyzir D. ([id number]) -------------------------------------------------------------------------------- Multi-Disciplinary Care Plan Details Patient Name: Joshua HoraMOREFIELD, Cristan D. Date of Service: 09/27/2020 11:00 AM Medical Record Number: [id number] Patient Account Number: [id number] Date of Birth/Sex: 07-13-1990 (30 y.o. M) Treating RN: Huel CoventryWoody, Kim Primary Care Cosette Prindle: Kandyce RudBabaoff, Marcus Other Clinician: Lolita CramBurnette, Kyara Referring Danuel Felicetti: Kandyce RudBabaoff, Marcus Treating Aishah Teffeteller/Extender: Altamese CarolinaOBSON, MICHAEL G Weeks in Treatment: 4 Active Inactive Necrotic Tissue Nursing Diagnoses: Impaired tissue integrity related to necrotic/devitalized tissue Goals: Necrotic/devitalized tissue will be minimized in the wound bed Date Initiated: 08/30/2020 Target Resolution Date: 09/06/2020 Goal Status: Active Interventions: Assess patient pain level pre-, during and post procedure and prior to discharge Treatment Activities: Enzymatic debridement : 08/30/2020 Notes: Pressure Nursing Diagnoses: Knowledge deficit related to management of pressures ulcers Potential for impaired tissue integrity related to pressure, friction, moisture, and shear Goals: Patient will remain free from development of additional pressure ulcers Date Initiated: 08/30/2020 Target Resolution Date: 09/20/2020 Goal Status: Active Patient/caregiver will verbalize understanding of pressure ulcer management Date Initiated: 08/30/2020 Target Resolution Date: 09/20/2020 Goal Status: Active Interventions: Provide education on pressure ulcers Notes: Wound/Skin Impairment Nursing Diagnoses: Knowledge deficit related to ulceration/compromised skin integrity Goals: Patient/caregiver will verbalize understanding of  skin care regimen Date Initiated: 08/30/2020 Target Resolution Date: 09/06/2020 Goal Status: Active Ulcer/skin breakdown will have a volume reduction of 30% by week 4 Date Initiated: 08/30/2020 Target Resolution Date: 09/30/2020 Goal Status: Active Interventions: Assess ulceration(s) every visit Provide education on ulcer and skin care [place], Jamarri D. ([id number]) Treatment Activities: Referred to DME Ryu Cerreta for dressing supplies : 08/30/2020 Skin care regimen initiated : 08/30/2020 Topical wound management initiated : 08/30/2020 Notes: Electronic Signature(s) Signed: 09/27/2020 4:08:37 PM By: Elliot GurneyWoody, BSN, RN, CWS, Kim RN, BSN Entered By: Elliot GurneyWoody, BSN, RN, CWS, Kim on 09/27/2020 11:43:15 Reever, Braylee D. ([id number]) -------------------------------------------------------------------------------- Pain Assessment Details Patient Name: Joshua HoraMOREFIELD, Adarsh D. Date of Service: 09/27/2020 11:00 AM Medical Record Number: [id number] Patient Account Number: [id number] Date of Birth/Sex: 07-13-1990 (30 y.o. M) Treating RN: Huel CoventryWoody, Kim Primary Care Monserath Neff: Kandyce RudBabaoff, Marcus Other Clinician: Lolita CramBurnette, Kyara Referring Shanin Szymanowski: Kandyce RudBabaoff, Marcus Treating Marlow Berenguer/Extender: Altamese CarolinaOBSON, MICHAEL G Weeks in Treatment: 4 Active Problems Location of Pain Severity and Description of Pain Patient Has Paino No Site Locations Rate the pain. Current Pain Level: 0 Pain Management  and Medication Current Pain Management: Electronic Signature(s) Signed: 09/27/2020 4:08:37 PM By: Elliot Gurney, BSN, RN, CWS, Kim RN, BSN Signed: 09/27/2020 4:53:53 PM By: Lolita Cram Entered By: Lolita Cram on 09/27/2020 11:12:03 Hengst, Johan D. ([id number]) -------------------------------------------------------------------------------- Patient/Caregiver Education Details Patient Name: [name] Date of Service: 09/27/2020 11:00 AM Medical Record Number: [id number] Patient Account Number: [id number] Date of  Birth/Gender: 08/05/1990 (30 y.o. M) Treating RN: Huel Coventry Primary Care Physician: Kandyce Rud Other Clinician: Lolita Cram Referring Physician: Kandyce Rud Treating Physician/Extender: Altamese [place] in Treatment: 4 Education Assessment Education Provided To: Patient Education Topics Provided Pressure: Handouts: Pressure Ulcers: Care and Offloading, Preventing Pressure Ulcers Methods: Demonstration, Explain/Verbal Responses: State content correctly Wound/Skin Impairment: Handouts: Caring for Your Ulcer Methods: Demonstration, Explain/Verbal Responses: State content correctly Electronic Signature(s) Signed: 09/27/2020 4:08:37 PM By: Elliot Gurney, BSN, RN, CWS, Kim RN, BSN Entered By: Elliot Gurney, BSN, RN, CWS, Kim on 09/27/2020 11:51:23 Bear, Cervando D. ([id number]) -------------------------------------------------------------------------------- Wound Assessment Details Patient Name: [name] Date of Service: 09/27/2020 11:00 AM Medical Record Number: [id number] Patient Account Number: [id number] Date of Birth/Sex: [birth year] (30 y.o. M) Treating RN: Huel Coventry Primary Care Danaly Bari: Kandyce Rud Other Clinician: Lolita Cram Referring Farhana Fellows: Kandyce Rud Treating Mikolaj Woolstenhulme/Extender: Altamese [place] in Treatment: 4 Wound Status Wound Number: 1 Primary Etiology: Pressure Ulcer Wound Location: Sacrum Wound Status: Open Wounding Event: Gradually Appeared Comorbid History: Type II Diabetes Date Acquired: 08/04/2020 Weeks Of Treatment: 4 Clustered Wound: No Photos Wound Measurements Length: (cm) 0.6 % Reduct Width: (cm) 0.3 % Reduct Depth: (cm) 0.8 Epitheli Area: (cm) 0.141 Tunneli Volume: (cm) 0.113 Undermi Start Endin Maxim ion in Area: 93.6% ion in Volume: 95.7% alization: None ng: No ning: Yes ing Position (o'clock): 11 g Position (o'clock): 2 um Distance: (cm) 1 Wound Description Classification: Category/Stage  IV Foul Odo Exudate Amount: Medium Slough/F Exudate Type: Serous Exudate Color: amber r After Cleansing: No ibrino Yes Wound Bed Granulation Amount: Small (1-33%) Exposed Structure Granulation Quality: Pink Fascia Exposed: No Necrotic Amount: Large (67-100%) Fat Layer (Subcutaneous Tissue) Exposed: Yes Necrotic Quality: Adherent Slough Tendon Exposed: No Muscle Exposed: No Joint Exposed: No Bone Exposed: No Treatment Notes Wound #1 (Sacrum) Cleanser Normal Saline Berkland, Joven D. ([id number]) Discharge Instruction: Wash your hands with soap and water. Remove old dressing, discard into plastic bag and place into trash. Cleanse the wound with Normal Saline prior to applying a clean dressing using gauze sponges, not tissues or cotton balls. Do not scrub or use excessive force. Pat dry using gauze sponges, not tissue or cotton balls. Peri-Wound Care Topical Primary Dressing Prisma 4.34 (in) Discharge Instruction: Moisten w/normal saline or sterile water; Cover wound as directed. Do not remove from wound bed. Secondary Dressing Mepilex Border Flex, 4x4 (in/in) Discharge Instruction: Apply to wound as directed. Do not cut. Secured With Compression Wrap Compression Stockings Facilities manager) Signed: 09/27/2020 4:08:37 PM By: Elliot Gurney, BSN, RN, CWS, Kim RN, BSN Signed: 09/27/2020 4:53:53 PM By: Lolita Cram Entered By: Lolita Cram on 09/27/2020 11:21:11 Worley, Myrle D. ([id number]) -------------------------------------------------------------------------------- Vitals Details Patient Name: [name] Date of Service: 09/27/2020 11:00 AM Medical Record Number: [id number] Patient Account Number: [id number] Date of Birth/Sex: 05/12/1991 (30 y.o. M) Treating RN: Huel Coventry Primary Care Jandel Patriarca: Kandyce Rud Other Clinician: Lolita Cram Referring Mona Ayars: Kandyce Rud Treating Jadelin Eng/Extender: Altamese  in  Treatment: 4 Vital Signs Time Taken: 11:05 Pulse (bpm): 95 Height (in): 54 Respiratory Rate (breaths/min): 16 Weight (lbs): 50 Blood Pressure (mmHg): 125/85 Body  Mass Index (BMI): 12.1 Reference Range: 80 - 120 mg / dl Electronic Signature(s) Signed: 09/27/2020 4:53:53 PM By: Lolita Cram Entered By: Lolita Cram on 09/27/2020 11:11:55

## 2020-09-27 NOTE — Progress Notes (Signed)
Joshua, [name] ([id number]) Visit Report for 09/27/2020 Debridement Details Patient Name: [name], [name] Date of Service: 09/27/2020 11:00 AM Medical Record Number: [id number] Patient Account Number: [id number] Date of Birth/Sex: 1990/12/07 (29 y.o. M) Treating RN: [name] Primary Care Provider: Derinda [name] Other Clinician: Jeanine [name] Referring Provider: Derinda [name] Treating Provider/Extender: [name] in Treatment: 4 Debridement Performed for Wound #1 Sacrum Assessment: Performed By: Physician Joshua Dillon, MD Debridement Type: Debridement Level of Consciousness (Pre- Responds to Verbal Stimuli procedure): Pre-procedure Verification/Time Out No Taken: Total Area Debrided (L x W): 0.6 (cm) x 0.3 (cm) = 0.18 (cm) Tissue and other material Non-Viable, Skin: Dermis , Skin: Epidermis debrided: Level: Skin/Epidermis Debridement Description: Selective/Open Wound Instrument: Forceps, Scissors Bleeding: Minimum Hemostasis Achieved: Pressure Response to Treatment: Procedure was tolerated well Level of Consciousness (Post- Responds to Painful Stimuli procedure): Post Debridement Measurements of Total Wound Length: (cm) 0.6 Stage: Category/Stage IV Width: (cm) 0.3 Depth: (cm) 0.8 Volume: (cm) 0.113 Character of Wound/Ulcer Post Debridement: Stable Post Procedure Diagnosis Same as Pre-procedure Electronic Signature(s) Signed: 09/27/2020 4:08:37 PM By: [name], BSN, RN, CWS, Kim RN, BSN Signed: 09/27/2020 5:04:28 PM By: Joshua Ham MD Entered By: [name] on 09/27/2020 12:40:44 [name], [name] ([id number]) -------------------------------------------------------------------------------- HPI Details Patient Name: [name] Date of Service: 09/27/2020 11:00 AM Medical Record Number: [id number] Patient Account Number: [id number] Date of Birth/Sex: 1990/12/20 (29 y.o. M) Treating RN: [name] Primary Care  Provider: Derinda [name] Other Clinician: Jeanine [name] Referring Provider: Derinda [name] Treating Provider/Extender: [name] in Treatment: 4 History of Present Illness HPI Description: ADMISSION 08/30/2020 This is a difficult case of a unfortunate 30 year old man with cerebral palsy and seizures. 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 This caused quite a deterioration recently in his function he required placement of a PEG tube for feeding, he developed a significant left upper lobe aspiration driven lung met lung abscess for which she is taken Augmentin. He is nonambulatory. Nevertheless his parents were here today feel that he had ability to communicate and a good functional level up until very recently. He was hospitalized at the beginning of March for the pneumonia. During this time he was put in one of the hospital standard pressure prevention mattresses although this was apparently meant for an adult resulting in him sliding down and it and he developed a pressure sore on his lower sacrum. 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 They have been using Santyl. Apparently the nurse practitioner for palliative care who comes to see him in the home prescribed this Past medical history includes cerebral palsy with seizures, autoimmune encephalitis. He is a diabetic most recently listed in the [place] records as type I based on low protein C levels and an anti-GAD antibody however his parents state that this has recently been called into question however I did not delve into this further. He apparently has some form of ulcerative colitis, history of candidemia,  nephrolithiasis, he is tube feed dependent although they still give him something orally. Left upper lobe lung abscess followed by ID at [facility] currently on Augmentin 4/6 culture I did last week showed  staph lugdunensis. X-ray did not show osteomyelitis was a poor quality film they recommended a CT scan. They have been using Santyl-based dressings. Also note that they changed the antibiotics for the lung abscess to Levaquin and Flagyl from Augmentin. The Levaquin will indeed cover the coag negative staph we cultured. He does have exposed bone however I do not feel pressured to do a CT scan at this point. It would be possible to biopsy the bone should that become necessary 4/13; patient arrives in clinic still using Santyl to his wounds. His parents report up to 9 liquid bowel movements a day over the last 5 to 6 days. They go on to tell me that he is being worked up for a small bowel source of diarrhea. He does apparently not have celiac disease. There has been some thought about malabsorption. He had colonoscopy and endoscopy. 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 Since the patient was last here he has been to infectious disease at [facility] predominantly for evaluation of the lung abscess which is resolved however apparently has aspiration pneumonitis now is going back on an additional antibiotic. Stool has been tested for C. difficile toxin and many other pathogens with everything negative. Nevertheless multiple episodes of diarrhea continue. They are also apparently going to change his tube feeding to see if that helps with the diarrhea. Electronic Signature(s) Signed: 09/27/2020 5:04:28 PM By: Joshua Ham MD Entered By: [name] on 09/27/2020 12:42:16 [name], [name] ([id number]) -------------------------------------------------------------------------------- Physical Exam  Details Patient Name: [name] Date of Service: 09/27/2020 11:00 AM Medical Record Number: [id number] Patient Account Number: [id number] Date of Birth/Sex: [birth year] (29 y.o. M) Treating RN: [name] Primary Care Provider: Derinda [name] Other Clinician: Jeanine [name] Referring Provider: Derinda [name] Treating Provider/Extender: [name] in Treatment: 4 Constitutional Sitting or standing Blood Pressure is within target range for patient.. Pulse regular and within target range for patient.Marland Kitchen Respirations regular, non- labored and within target range.. Temperature is normal and within the target range for the patient.Marland Kitchen appears in no distress. Notes Wound exam; lower sacrum/coccyx. Still exposed bone with but with better granulation. I removed some skin and debris from the surface with pickups and scissors otherwise this does not require ongoing debridement. There is granulation here but is noted still exposed bone. No evidence of surrounding infection Electronic Signature(s) Signed: 09/27/2020 5:04:28 PM By: Joshua Ham MD Entered By: [name] on 09/27/2020 12:43:58 Friedt, Woodley D. ([id number]) -------------------------------------------------------------------------------- Physician Orders Details Patient Name: [name] Date of Service: 09/27/2020 11:00 AM Medical Record Number: [id number] Patient Account Number: [id number] Date of Birth/Sex: Dec 31, 1990 (29 y.o. M) Treating RN: [name] Primary Care Provider: Derinda [name] Other Clinician: Jeanine [name] Referring Provider: Derinda [name] Treating Provider/Extender: [name] in Treatment: 4 Verbal / Phone Orders: No Diagnosis Coding Follow-up Appointments o Return Appointment in 2 weeks. Bathing/ Shower/ Hygiene o Clean wound with Normal Saline or wound cleanser. Off-Loading o Turn and reposition every 2 hours o Other: - Keep all pressure of  of wounded area Wound Treatment Wound #1 - Sacrum Cleanser: Normal Saline (DME) (Generic) 1 x Per Day/30 Days Discharge Instructions: Wash your hands with soap and water. Remove old dressing, discard into plastic bag and place into trash. Cleanse the wound with Normal Saline prior to applying a clean dressing using gauze sponges, not tissues or cotton balls. Do not scrub or use  excessive force. Pat dry using gauze sponges, not tissue or cotton balls. Primary Dressing: Prisma 4.34 (in) (DME) (Generic) 1 x Per Day/30 Days Discharge Instructions: Moisten w/normal saline or sterile water; Cover wound as directed. Do not remove from wound bed. Secondary Dressing: Mepilex Border Flex, 4x4 (in/in) (DME) (Generic) 1 x Per Day/30 Days Discharge Instructions: Apply to wound as directed. Do not cut. Notes VOB for [employer]) Signed: 09/27/2020 4:08:37 PM By: [name], BSN, RN, CWS, Kim RN, BSN Signed: 09/27/2020 5:04:28 PM By: Joshua Ham MD Entered By: [name] on 09/27/2020 11:50:41 Briggs, Eoin D. ([id number]) -------------------------------------------------------------------------------- Problem List Details Patient Name: [name] Date of Service: 09/27/2020 11:00 AM Medical Record Number: [id number] Patient Account Number: [id number] Date of Birth/Sex: 11/15/1990 (29 y.o. M) Treating RN: [name] Primary Care Provider: Derinda [name] Other Clinician: Jeanine [name] Referring Provider: Derinda [name] Treating Provider/Extender: [name] in Treatment: 4 Active Problems ICD-10 Encounter Code Description Active Date MDM Diagnosis L89.154 Pressure ulcer of sacral region, stage 4 08/30/2020 No Yes E43 Unspecified severe protein-calorie malnutrition 08/30/2020 No Yes G80.9 Cerebral palsy, unspecified 08/30/2020 No Yes Inactive Problems Resolved Problems Electronic Signature(s) Signed: 09/27/2020 5:04:28 PM By: Joshua Ham MD Entered By: [name] on 09/27/2020 12:40:23 Pinales, Corbett D. ([id number]) -------------------------------------------------------------------------------- Progress Note Details Patient Name: [name] Date of Service: 09/27/2020 11:00 AM Medical Record Number: [id number] Patient Account Number: [id number] Date of Birth/Sex: 07-14-1990 (29 y.o. M) Treating RN: [name] Primary Care Provider: Derinda [name] Other Clinician: Jeanine [name] Referring Provider: Derinda [name] Treating Provider/Extender: [name] in Treatment: 4 Subjective History of Present Illness (HPI) ADMISSION 08/30/2020 This is a difficult case of a unfortunate 30 year old man with cerebral palsy and seizures. 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 This caused quite a deterioration recently in his function he required placement of a PEG tube for feeding, he developed a significant left upper lobe aspiration driven lung met lung abscess for which she is taken Augmentin. He is nonambulatory. Nevertheless his parents were here today feel that he had ability to communicate and a good functional level up until very recently. He was hospitalized at the beginning of March for the pneumonia. During this time he was put in one of the hospital standard pressure prevention mattresses although this was apparently meant for an adult resulting in him sliding down and it and he developed a pressure sore on his lower sacrum. 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 They have been using Santyl. Apparently the nurse practitioner for palliative care who comes to see him in the home  prescribed this Past medical history includes cerebral palsy with seizures, autoimmune encephalitis. He is a diabetic most recently listed in the [place] records as type I based on low protein C levels and an anti-GAD antibody however his parents state that this has recently been called into question however I did not delve into this further. He apparently has some form of ulcerative colitis, history of candidemia, nephrolithiasis, he is tube feed dependent although they still give him something orally. Left upper lobe lung abscess followed by ID at [facility] [place]  currently on Augmentin 4/6 culture I did last week showed staph lugdunensis. X-ray did not show osteomyelitis was a poor quality film they recommended a CT scan. They have been using Santyl-based dressings. Also note that they changed the antibiotics for the lung abscess to Levaquin and Flagyl from Augmentin. The Levaquin will indeed cover the coag negative staph we cultured. He does have exposed bone however I do not feel pressured to do a CT scan at this point. It would be possible to biopsy the bone should that become necessary 4/13; patient arrives in clinic still using Santyl to his wounds. His parents report up to 9 liquid bowel movements a day over the last 5 to 6 days. They go on to tell me that he is being worked up for a small bowel source of diarrhea. He does apparently not have celiac disease. There has been some thought about malabsorption. He had colonoscopy and endoscopy. 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 Since the patient was last here he has been to infectious disease at [facility] predominantly for evaluation of the lung abscess which is resolved however apparently has aspiration pneumonitis now is going back on an additional antibiotic. Stool has been tested for C. difficile toxin and many other pathogens with everything  negative. Nevertheless multiple episodes of diarrhea continue. They are also apparently going to change his tube feeding to see if that helps with the diarrhea. Objective Constitutional Sitting or standing Blood Pressure is within target range for patient.. Pulse regular and within target range for patient.Marland Kitchen Respirations regular, non- labored and within target range.. Temperature is normal and within the target range for the patient.Marland Kitchen appears in no distress. Vitals Time Taken: 11:05 AM, Height: 54 in, Weight: 50 lbs, BMI: 12.1, Pulse: 95 bpm, Respiratory Rate: 16 breaths/min, Blood Pressure: 125/85 mmHg. General Notes: Wound exam; lower sacrum/coccyx. Still exposed bone with but with better granulation. I removed some skin and debris from the surface with pickups and scissors otherwise this does not require ongoing debridement. There is granulation here but is noted still exposed bone. No evidence of surrounding infection Integumentary (Hair, Skin) Wound #1 status is Open. Original cause of wound was Gradually Appeared. The date acquired was: 08/04/2020. The wound has been in treatment 4 weeks. The wound is located on the Sacrum. The wound measures 0.6cm length x 0.3cm width x 0.8cm depth; 0.141cm^2 area and 0.113cm^3 volume. There is Fat Layer (Subcutaneous Tissue) exposed. There is no tunneling noted, however, there is undermining starting at 11:00 and ending at 2:00 with a maximum distance of 1cm. There is a medium amount of serous drainage noted. There is small (1-33%) pink granulation within the wound bed. There is a large (67-100%) amount of necrotic tissue within the wound bed including Adherent Slough. Difiore, Abed D. ([id number]) Assessment Active Problems ICD-10 Pressure ulcer of sacral region, stage 4 Unspecified severe protein-calorie malnutrition Cerebral palsy, unspecified Procedures Wound #1 Pre-procedure diagnosis of Wound #1 is a Pressure Ulcer located on the Sacrum .  There was a Selective/Open Wound Skin/Epidermis Debridement with a total area of 0.18 sq cm performed by Joshua Dillon, MD. With the following instrument(s): Forceps, and Scissors to remove Non-Viable tissue/material. Material removed includes Skin: Dermis and Skin: Epidermis and. No specimens were taken.A Minimum amount of bleeding was controlled with Pressure. The procedure was tolerated well. Post Debridement Measurements: 0.6cm length x 0.3cm width x 0.8cm depth; 0.113cm^3  volume. Post debridement Stage noted as Category/Stage IV. Character of Wound/Ulcer Post Debridement is stable. Post procedure Diagnosis Wound #1: Same as Pre-Procedure Plan Follow-up Appointments: Return Appointment in 2 weeks. Bathing/ Shower/ Hygiene: Clean wound with Normal Saline or wound cleanser. Off-Loading: Turn and reposition every 2 hours Other: - Keep all pressure of of wounded area General Notes: VOB for [facility] WOUND #1: - Sacrum Wound Laterality: Cleanser: Normal Saline (DME) (Generic) 1 x Per Day/30 Days Discharge Instructions: Wash your hands with soap and water. Remove old dressing, discard into plastic bag and place into trash. Cleanse the wound with Normal Saline prior to applying a clean dressing using gauze sponges, not tissues or cotton balls. Do not scrub or use excessive force. Pat dry using gauze sponges, not tissue or cotton balls. Primary Dressing: Prisma 4.34 (in) (DME) (Generic) 1 x Per Day/30 Days Discharge Instructions: Moisten w/normal saline or sterile water; Cover wound as directed. Do not remove from wound bed. Secondary Dressing: Mepilex Border Flex, 4x4 (in/in) (DME) (Generic) 1 x Per Day/30 Days Discharge Instructions: Apply to wound as directed. Do not cut. 1. I change the primary dressing to silver collagen 2. We are going to look into [facility] to see if this is covered by insurance 3. Might also consider a snap VAC again if this is covered by insurance 4. This was initially  a wound covered in necrotic surface. That part of this is better. I think the Santyl as I would livedo to usefulness at this point. 5. No clear evidence of active infection. Noteworthy that the patient apparently is going back on a different antibiotic now as directed by infectious disease for pneumonia [aspiration) Electronic Signature(s) Signed: 09/27/2020 5:04:28 PM By: Joshua Ham MD Entered By: [name] on 09/27/2020 12:45:14 [place], Kijana D. ([id number]) -------------------------------------------------------------------------------- SuperBill Details Patient Name: [name] Date of Service: 09/27/2020 Medical Record Number: [id number] Patient Account Number: [id number] Date of Birth/Sex: 07/08/1990 (29 y.o. M) Treating RN: [name] Primary Care Provider: Derinda [name] Other Clinician: Jeanine [name] Referring Provider: Derinda [name] Treating Provider/Extender: [name] in Treatment: 4 Diagnosis Coding ICD-10 Codes Code Description [phone] Pressure ulcer of sacral region, stage 4 E43 Unspecified severe protein-calorie malnutrition G80.9 Cerebral palsy, unspecified Facility Procedures CPT4 Code: [id number] Description: [phone] - DEBRIDE WOUND 1ST 20 SQ CM OR < Modifier: Quantity: 1 CPT4 Code: Description: ICD-10 Diagnosis Description L89.154 Pressure ulcer of sacral region, stage 4 Modifier: Quantity: Physician Procedures CPT4 Code: [id number] Description: [id number] - WC PHYS DEBR WO ANESTH 20 SQ CM Modifier: Quantity: 1 CPT4 Code: Description: ICD-10 Diagnosis Description L89.154 Pressure ulcer of sacral region, stage 4 Modifier: Quantity: Electronic Signature(s) Signed: 09/27/2020 5:04:28 PM By: Joshua Ham MD Entered By: [name] on 09/27/2020 12:45:29

## 2020-10-04 ENCOUNTER — Other Ambulatory Visit: Payer: Self-pay

## 2020-10-04 ENCOUNTER — Encounter: Payer: PRIVATE HEALTH INSURANCE | Attending: Internal Medicine | Admitting: Internal Medicine

## 2020-10-04 DIAGNOSIS — E11622 Type 2 diabetes mellitus with other skin ulcer: Secondary | ICD-10-CM | POA: Diagnosis present

## 2020-10-04 DIAGNOSIS — L89154 Pressure ulcer of sacral region, stage 4: Secondary | ICD-10-CM | POA: Insufficient documentation

## 2020-10-04 DIAGNOSIS — K5190 Ulcerative colitis, unspecified, without complications: Secondary | ICD-10-CM | POA: Insufficient documentation

## 2020-10-04 DIAGNOSIS — E43 Unspecified severe protein-calorie malnutrition: Secondary | ICD-10-CM | POA: Diagnosis not present

## 2020-10-04 DIAGNOSIS — R569 Unspecified convulsions: Secondary | ICD-10-CM | POA: Insufficient documentation

## 2020-10-04 DIAGNOSIS — G809 Cerebral palsy, unspecified: Secondary | ICD-10-CM | POA: Insufficient documentation

## 2020-10-04 NOTE — Progress Notes (Signed)
RAFFI, MILSTEIN ([id number]) Visit Report for 10/04/2020 Arrival Information Details Patient Name: Joshua, [name] Date of Service: 10/04/2020 9:45 AM Medical Record Number: [id number] Patient Account Number: [id number] Date of Birth/Sex: [birth year] (29 y.o. M) Treating RN: Hansel Feinstein Primary Care Archibald Marchetta: Kandyce Rud Other Clinician: Referring [place] Ravenscroft: Kandyce Rud Treating Chenee Munns/Extender: Altamese [place] in Treatment: 5 Visit Information History Since Last Visit Added or deleted any medications: No Patient Arrived: Wheel Chair Had a fall or experienced change in No Arrival Time: 10:06 activities of daily living that may affect Accompanied By: dad risk of falls: Transfer Assistance: Manual Hospitalized since last visit: No Patient Identification Verified: Yes Has Dressing in Place as Prescribed: Yes Secondary Verification Process Completed: Yes Pain Present Now: No Patient Requires Transmission-Based Precautions: No Patient Has Alerts: No Electronic Signature(s) Signed: 10/04/2020 4:20:07 PM By: Hansel Feinstein Entered By: Hansel Feinstein on 10/04/2020 10:06:36 [name], [name] ([id number]) -------------------------------------------------------------------------------- Clinic Level of Care Assessment Details Patient Name: [name] Date of Service: 10/04/2020 9:45 AM Medical Record Number: [id number] Patient Account Number: [id number] Date of Birth/Sex: 12-06-1990 (29 y.o. M) Treating RN: Rogers Blocker Primary Care Maddisyn Hegwood: Kandyce Rud Other Clinician: Referring Pennie Vanblarcom: Kandyce Rud Treating Haisley Arens/Extender: Altamese [place] in Treatment: 5 Clinic Level of Care Assessment Items TOOL 1 Quantity Score []  - Use when EandM and Procedure is performed on INITIAL visit 0 ASSESSMENTS - Nursing Assessment / Reassessment []  - General Physical Exam (combine w/ comprehensive assessment (listed just below) when performed on  new 0 pt. evals) []  - 0 Comprehensive Assessment (HX, ROS, Risk Assessments, Wounds Hx, etc.) ASSESSMENTS - Wound and Skin Assessment / Reassessment []  - Dermatologic / Skin Assessment (not related to wound area) 0 ASSESSMENTS - Ostomy and/or Continence Assessment and Care []  - Incontinence Assessment and Management 0 []  - 0 Ostomy Care Assessment and Management (repouching, etc.) PROCESS - Coordination of Care []  - Simple Patient / Family Education for ongoing care 0 []  - 0 Complex (extensive) Patient / Family Education for ongoing care []  - 0 Staff obtains , Records, Test Results / Process Orders []  - 0 Staff telephones HHA, Nursing Homes / Clarify orders / etc []  - 0 Routine Transfer to another Facility (non-emergent condition) []  - 0 Routine Hospital Admission (non-emergent condition) []  - 0 New Admissions / / Ordering NPWT, Apligraf, etc. []  - 0 Emergency Hospital Admission (emergent condition) PROCESS - Special Needs []  - Pediatric / Minor Patient Management 0 []  - 0 Isolation Patient Management []  - 0 Hearing / Language / Visual special needs []  - 0 Assessment of Community assistance (transportation, D/C planning, etc.) []  - 0 Additional assistance / Altered mentation []  - 0 Support Surface(s) Assessment (bed, cushion, seat, etc.) INTERVENTIONS - Miscellaneous []  - External ear exam 0 []  - 0 Patient Transfer (multiple staff / / Similar devices) []  - 0 Simple Staple / Suture removal (25 or less) []  - 0 Complex Staple / Suture removal (26 or more) []  - 0 Hypo/Hyperglycemic Management (do not check if billed separately) []  - 0 Ankle / Brachial Index (ABI) - do not check if billed separately Has the patient been seen at the hospital within the last three years: Yes Total Score: 0 Level Of Care: ____ ( ) Electronic Signature(s) Signed: 10/04/2020 12:09:25 PM By: ,  RN Entered By: , Chiropractor on 10/04/2020 10:54:36 [name], [name] ( ) -------------------------------------------------------------------------------- Encounter Discharge Information Details Patient Name: , [name] Date  of Service: 10/04/2020 9:45 AM Medical Record Number: [id number] Patient Account Number: [id number] Date of Birth/Sex: Aug 27, 1990 (29 y.o. M) Treating RN: Huel Coventry Primary Care Kearston Putman: Kandyce Rud Other Clinician: Referring Anouk Critzer: Kandyce Rud Treating Saja Bartolini/Extender: Altamese [place] in Treatment: 5 Encounter Discharge Information Items Post Procedure Vitals Discharge Condition: Stable Temperature (F): 96.9 Ambulatory Status: Wheelchair Pulse (bpm): 93 Discharge Destination: Home Respiratory Rate (breaths/min): 16 Transportation: Private Auto Blood Pressure (mmHg): 122/75 Accompanied By: father Schedule Follow-up Appointment: Yes Clinical Summary of Care: Electronic Signature(s) Signed: 10/04/2020 4:20:32 PM By: Lolita Cram Entered By: Lolita Cram on 10/04/2020 11:15:31 [name], [name] ([id number]) -------------------------------------------------------------------------------- Lower Extremity Assessment Details Patient Name: [name] Date of Service: 10/04/2020 9:45 AM Medical Record Number: [id number] Patient Account Number: [id number] Date of Birth/Sex: 1991/01/18 (29 y.o. M) Treating RN: Hansel Feinstein Primary Care Alpha Chouinard: Kandyce Rud Other Clinician: Referring Arthuro Canelo: Kandyce Rud Treating Sabino Denning/Extender: Altamese [place] in Treatment: 5 Electronic Signature(s) Signed: 10/04/2020 4:20:07 PM By: Hansel Feinstein Entered By: Hansel Feinstein on 10/04/2020 10:10:24 [name], [name] ([id number]) -------------------------------------------------------------------------------- Multi Wound Chart Details Patient Name: [name] Date of Service: 10/04/2020  9:45 AM Medical Record Number: [id number] Patient Account Number: [id number] Date of Birth/Sex: May 15, 1991 (29 y.o. M) Treating RN: Rogers Blocker Primary Care Amrit Erck: Kandyce Rud Other Clinician: Referring Emeri Estill: Kandyce Rud Treating Rosario Kushner/Extender: Altamese [place] in Treatment: 5 Vital Signs Height(in): 54 Pulse(bpm): 93 Weight(lbs): 50 Blood Pressure(mmHg): 122/75 Body Mass Index(BMI): 12 Temperature(F): 96.9 Respiratory Rate(breaths/min): 16 Photos: [N/A:N/A] Wound Location: Sacrum N/A N/A Wounding Event: Gradually Appeared N/A N/A Primary Etiology: Pressure Ulcer N/A N/A Comorbid History: Type II Diabetes N/A N/A Date Acquired: 08/04/2020 N/A N/A Weeks of Treatment: 5 N/A N/A Wound Status: Open N/A N/A Measurements L x W x D (cm) 0.6x0.7x0.8 N/A N/A Area (cm) : 0.33 N/A N/A Volume (cm) : 0.264 N/A N/A % Reduction in Area: 85.00% N/A N/A % Reduction in Volume: 90.00% N/A N/A Starting Position 1 (o'clock):11 Ending Position 1 (o'clock): 2 Maximum Distance 1 (cm): 0.9 Undermining: Yes N/A N/A Classification: Category/Stage IV N/A N/A Exudate Amount: Medium N/A N/A Exudate Type: Serous N/A N/A Exudate Color: amber N/A N/A Granulation Amount: Small (1-33%) N/A N/A Granulation Quality: Pink N/A N/A Necrotic Amount: Large (67-100%) N/A N/A Exposed Structures: Fat Layer (Subcutaneous Tissue): N/A N/A Yes Fascia: No Tendon: No Muscle: No Joint: No Bone: No Epithelialization: None N/A N/A Debridement: Debridement - Excisional N/A N/A Pre-procedure Verification/Time 10:44 N/A N/A Out Taken: Tissue Debrided: Subcutaneous, Slough N/A N/A Level: Skin/Subcutaneous Tissue N/A N/A Debridement Area (sq cm): 0.42 N/A N/A Instrument: Curette N/A N/A Bleeding: Minimum N/A N/A Hemostasis Achieved: Pressure N/A N/A Debridement Treatment Procedure was tolerated well N/A N/A Response: Bass, Malin D. ([id number]) Post Debridement 0.6x0.7x0.9 N/A  N/A Measurements L x W x D (cm) Post Debridement Volume: 0.297 N/A N/A (cm) Post Debridement Stage: Category/Stage IV N/A N/A Procedures Performed: Debridement N/A N/A Treatment Notes Wound #1 (Sacrum) Cleanser Normal Saline Discharge Instruction: Wash your hands with soap and water. Remove old dressing, discard into plastic bag and place into trash. Cleanse the wound with Normal Saline prior to applying a clean dressing using gauze sponges, not tissues or cotton balls. Do not scrub or use excessive force. Pat dry using gauze sponges, not tissue or cotton balls. Peri-Wound Care Topical Primary Dressing Prisma 4.34 (in) Discharge Instruction: Moisten w/normal saline or sterile water; Cover wound as directed. Do not remove from wound bed. Secondary Dressing Foam Dressing, 4x4 (  in/in) Secured With Tegaderm Film 4x4 (in/in) Discharge Instruction: Apply to wound bed Compression Wrap Compression Stockings Add-Ons Electronic Signature(s) Signed: 10/04/2020 4:35:29 PM By: Baltazar Najjar MD Entered By: Baltazar Najjar on 10/04/2020 11:19:30 [name], [name] ([id number]) -------------------------------------------------------------------------------- Multi-Disciplinary Care Plan Details Patient Name: [name] Date of Service: 10/04/2020 9:45 AM Medical Record Number: [id number] Patient Account Number: [id number] Date of Birth/Sex: [birth year] (29 y.o. M) Treating RN: Rogers Blocker Primary Care Charles Andringa: Kandyce Rud Other Clinician: Referring Modean Mccullum: Kandyce Rud Treating Shellby Schlink/Extender: Altamese [place] in Treatment: 5 Active Inactive Necrotic Tissue Nursing Diagnoses: Impaired tissue integrity related to necrotic/devitalized tissue Goals: Necrotic/devitalized tissue will be minimized in the wound bed Date Initiated: 08/30/2020 Target Resolution Date: 09/06/2020 Goal Status: Active Interventions: Assess patient pain level pre-, during and post  procedure and prior to discharge Treatment Activities: Enzymatic debridement : 08/30/2020 Notes: Pressure Nursing Diagnoses: Knowledge deficit related to management of pressures ulcers Potential for impaired tissue integrity related to pressure, friction, moisture, and shear Goals: Patient will remain free from development of additional pressure ulcers Date Initiated: 08/30/2020 Target Resolution Date: 09/20/2020 Goal Status: Active Patient/caregiver will verbalize understanding of pressure ulcer management Date Initiated: 08/30/2020 Target Resolution Date: 09/20/2020 Goal Status: Active Interventions: Provide education on pressure ulcers Notes: Wound/Skin Impairment Nursing Diagnoses: Knowledge deficit related to ulceration/compromised skin integrity Goals: Patient/caregiver will verbalize understanding of skin care regimen Date Initiated: 08/30/2020 Target Resolution Date: 09/06/2020 Goal Status: Active Ulcer/skin breakdown will have a volume reduction of 30% by week 4 Date Initiated: 08/30/2020 Target Resolution Date: 09/30/2020 Goal Status: Active Interventions: Assess ulceration(s) every visit Provide education on ulcer and skin care [name], [name] ([id number]) Treatment Activities: Referred to DME Trella Thurmond for dressing supplies : 08/30/2020 Skin care regimen initiated : 08/30/2020 Topical wound management initiated : 08/30/2020 Notes: Electronic Signature(s) Signed: 10/04/2020 12:09:25 PM By: Phillis Haggis, Dondra Prader RN Entered By: Phillis Haggis, Dondra Prader on 10/04/2020 10:41:48 [name], [name] ([id number]) -------------------------------------------------------------------------------- Pain Assessment Details Patient Name: [name] Date of Service: 10/04/2020 9:45 AM Medical Record Number: [id number] Patient Account Number: [id number] Date of Birth/Sex: 09-23-1990 (29 y.o. M) Treating RN: Hansel Feinstein Primary Care Paulo Keimig: Kandyce Rud Other  Clinician: Referring Tiaira Arambula: Kandyce Rud Treating Sylvia Kondracki/Extender: Altamese [place] in Treatment: 5 Active Problems Location of Pain Severity and Description of Pain Patient Has Paino Patient Unable to Respond Site Locations Rate the pain. Current Pain Level: 0 Pain Management and Medication Current Pain Management: Notes no non verbal assessment of pain-0 Electronic Signature(s) Signed: 10/04/2020 4:20:07 PM By: Hansel Feinstein Entered By: Hansel Feinstein on 10/04/2020 10:07:18 [name], [name] ([id number]) -------------------------------------------------------------------------------- Patient/Caregiver Education Details Patient Name: [name] Date of Service: 10/04/2020 9:45 AM Medical Record Number: [id number] Patient Account Number: [id number] Date of Birth/Gender: [birth year] (29 y.o. M) Treating RN: Rogers Blocker Primary Care Physician: Kandyce Rud Other Clinician: Referring Physician: Kandyce Rud Treating Physician/Extender: Altamese [place] in Treatment: 5 Education Assessment Education Provided To: Patient Education Topics Provided Wound/Skin Impairment: Methods: Explain/Verbal Responses: State content correctly Electronic Signature(s) Signed: 10/04/2020 12:09:25 PM By: Phillis Haggis, Dondra Prader RN Entered By: Phillis Haggis, Dondra Prader on 10/04/2020 10:54:59 [name], [name] ([id number]) -------------------------------------------------------------------------------- Wound Assessment Details Patient Name: [name] Date of Service: 10/04/2020 9:45 AM Medical Record Number: [id number] Patient Account Number: [id number] Date of Birth/Sex: 1991-01-12 (29 y.o. M) Treating RN: Hansel Feinstein Primary Care Haydyn Girvan: Kandyce Rud Other Clinician: Referring Vivek Grealish: Kandyce Rud Treating Manon Banbury/Extender: Altamese [place] in Treatment: 5 Wound Status Wound Number:  1 Primary Etiology: Pressure Ulcer Wound Location:  Sacrum Wound Status: Open Wounding Event: Gradually Appeared Comorbid History: Type II Diabetes Date Acquired: 08/04/2020 Weeks Of Treatment: 5 Clustered Wound: No Photos Wound Measurements Length: (cm) 0.6 % Reduct Width: (cm) 0.7 % Reduct Depth: (cm) 0.8 Epitheli Area: (cm) 0.33 Undermi Volume: (cm) 0.264 Star Endin Maxim ion in Area: 85% ion in Volume: 90% alization: None ning: Yes ting Position (o'clock): 11 g Position (o'clock): 2 um Distance: (cm) 0.9 Wound Description Classification: Category/Stage IV Foul Odo Exudate Amount: Medium Slough/F Exudate Type: Serous Exudate Color: amber r After Cleansing: No ibrino Yes Wound Bed Granulation Amount: Small (1-33%) Exposed Structure Granulation Quality: Pink Fascia Exposed: No Necrotic Amount: Large (67-100%) Fat Layer (Subcutaneous Tissue) Exposed: Yes Necrotic Quality: Adherent Slough Tendon Exposed: No Muscle Exposed: No Joint Exposed: No Bone Exposed: No Treatment Notes Wound #1 (Sacrum) Cleanser Normal Saline [name], [name] ([id number]) Discharge Instruction: Wash your hands with soap and water. Remove old dressing, discard into plastic bag and place into trash. Cleanse the wound with Normal Saline prior to applying a clean dressing using gauze sponges, not tissues or cotton balls. Do not scrub or use excessive force. Pat dry using gauze sponges, not tissue or cotton balls. Peri-Wound Care Topical Primary Dressing Prisma 4.34 (in) Discharge Instruction: Moisten w/normal saline or sterile water; Cover wound as directed. Do not remove from wound bed. Secondary Dressing Foam Dressing, 4x4 (in/in) Secured With Tegaderm Film 4x4 (in/in) Discharge Instruction: Apply to wound bed Compression Wrap Compression Stockings Add-Ons Electronic Signature(s) Signed: 10/04/2020 4:20:07 PM By: Hansel FeinsteinBishop, Joy Entered By: Hansel FeinsteinBishop, Joy on 10/04/2020 10:10:07 [name], [name]  ([id number]) -------------------------------------------------------------------------------- Vitals Details Patient Name: Joshua HoraMOREFIELD, [name] Date of Service: 10/04/2020 9:45 AM Medical Record Number: [id number] Patient Account Number: [id number] Date of Birth/Sex: 01-04-1991 (29 y.o. M) Treating RN: Hansel FeinsteinBishop, Joy Primary Care Mckinna Demars: Kandyce RudBabaoff, Marcus Other Clinician: Referring Tyeson Tanimoto: Kandyce RudBabaoff, Marcus Treating Lillard Bailon/Extender: Altamese CarolinaOBSON, MICHAEL G Weeks in Treatment: 5 Vital Signs Time Taken: 10:04 Temperature (F): 96.9 Height (in): 54 Pulse (bpm): 93 Weight (lbs): 50 Respiratory Rate (breaths/min): 16 Body Mass Index (BMI): 12.1 Blood Pressure (mmHg): 122/75 Reference Range: 80 - 120 mg / dl Electronic Signature(s) Signed: 10/04/2020 4:20:07 PM By: Hansel FeinsteinBishop, Joy Entered ByHansel Feinstein: Bishop, Joy on 10/04/2020 10:06:55

## 2020-10-04 NOTE — Progress Notes (Signed)
JERIS, ROSER ([id number]) Visit Report for 10/04/2020 Debridement Details Patient Name: [name], [name] Date of Service: 10/04/2020 9:45 AM Medical Record Number: [id number] Patient Account Number: [id number] Date of Birth/Sex: September 18, 1990 (30 y.o. M) Treating RN: Dolan Amen Primary Care Provider: Derinda Late Other Clinician: Referring Provider: Derinda Late Treating Provider/Extender: Tito Dine in Treatment: 5 Debridement Performed for Wound #1 Sacrum Assessment: Performed By: Physician Ricard Dillon, MD Debridement Type: Debridement Level of Consciousness (Pre- Awake and Alert procedure): Pre-procedure Verification/Time Out Yes - 10:44 Taken: Start Time: 10:44 Total Area Debrided (L x W): 0.6 (cm) x 0.7 (cm) = 0.42 (cm) Tissue and other material Non-Viable, Slough, Subcutaneous, Slough debrided: Level: Skin/Subcutaneous Tissue Debridement Description: Excisional Instrument: Curette Bleeding: Minimum Hemostasis Achieved: Pressure Response to Treatment: Procedure was tolerated well Level of Consciousness (Post- Awake and Alert procedure): Post Debridement Measurements of Total Wound Length: (cm) 0.6 Stage: Category/Stage IV Width: (cm) 0.7 Depth: (cm) 0.9 Volume: (cm) 0.297 Character of Wound/Ulcer Post Debridement: Stable Post Procedure Diagnosis Same as Pre-procedure Electronic Signature(s) Signed: 10/04/2020 12:09:25 PM By: Charlett Nose RN Signed: 10/04/2020 4:35:29 PM By: Linton Ham MD Entered By: Georges Mouse, Minus Breeding on 10/04/2020 10:46:43 Pieroni, Reyaan D. ([id number]) -------------------------------------------------------------------------------- HPI Details Patient Name: [name] Date of Service: 10/04/2020 9:45 AM Medical Record Number: [id number] Patient Account Number: [id number] Date of Birth/Sex: 10/29/1990 (30 y.o. M) Treating RN: Cornell Barman Primary Care Provider: Derinda Late  Other Clinician: Referring Provider: Derinda Late Treating Provider/Extender: Tito Dine in Treatment: 5 History of Present Illness HPI Description: ADMISSION 08/30/2020 This is a difficult case of a unfortunate 30 year old man with cerebral palsy and seizures. 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 This caused quite a deterioration recently in his function he required placement of a PEG tube for feeding, he developed a significant left upper lobe aspiration driven lung met lung abscess for which she is taken Augmentin. He is nonambulatory. Nevertheless his parents were here today feel that he had ability to communicate and a good functional level up until very recently. He was hospitalized at the beginning of March for the pneumonia. During this time he was put in one of the hospital standard pressure prevention mattresses although this was apparently meant for an adult resulting in him sliding down and it and he developed a pressure sore on his lower sacrum. 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 They have been using Santyl. Apparently the nurse practitioner for palliative care who comes to see him in the home prescribed this Past medical history includes cerebral palsy with seizures, autoimmune encephalitis. He is a diabetic most recently listed in the [place] records as type I based on low protein C levels and an anti-GAD antibody however his parents state that this has recently been called into question however I did not delve into this further. He apparently has some form of ulcerative colitis, history of candidemia, nephrolithiasis, he is tube feed dependent although they  still give him something orally. Left upper lobe lung abscess followed by ID at [facility] currently on Augmentin 4/6 culture I did last week showed staph lugdunensis. X-ray did not show  osteomyelitis was a poor quality film they recommended a CT scan. They have been using Santyl-based dressings. Also note that they changed the antibiotics for the lung abscess to Levaquin and Flagyl from Augmentin. The Levaquin will indeed cover the coag negative staph we cultured. He does have exposed bone however I do not feel pressured to do a CT scan at this point. It would be possible to biopsy the bone should that become necessary 4/13; patient arrives in clinic still using Santyl to his wounds. His parents report up to 9 liquid bowel movements a day over the last 5 to 6 days. They go on to tell me that he is being worked up for a small bowel source of diarrhea. He does apparently not have celiac disease. There has been some thought about malabsorption. He had colonoscopy and endoscopy. 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 Since the patient was last here he has been to infectious disease at [facility] predominantly for evaluation of the lung abscess which is resolved however apparently has aspiration pneumonitis now is going back on an additional antibiotic. Stool has been tested for C. difficile toxin and many other pathogens with everything negative. Nevertheless multiple episodes of diarrhea continue. They are also apparently going to change his tube feeding to see if that helps with the diarrhea. 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 He is approved for Grafix but I cannot really determine what co-pay has. It would appear that the maximum is to 37.75 for a 2 x 3 cm. Electronic Signature(s) Signed: 10/04/2020 4:35:29 PM By: Linton Ham MD Entered By: Linton Ham  on 10/04/2020 11:21:06 Yanni, Niko D. ([id number]) -------------------------------------------------------------------------------- Physical Exam Details Patient Name: [name] Date of Service: 10/04/2020 9:45 AM Medical Record Number: [id number] Patient Account Number: [id number] Date of Birth/Sex: 12/30/1990 (29 y.o. M) Treating RN: Cornell Barman Primary Care Provider: Derinda Late Other Clinician: Referring Provider: Derinda Late Treating Provider/Extender: Tito Dine in Treatment: 5 Constitutional Sitting or standing Blood Pressure is within target range for patient.. Pulse regular and within target range for patient.Marland Kitchen Respirations regular, non- labored and within target range.. Temperature is normal and within the target range for the patient.Marland Kitchen appears in no distress. Very frail young man. Notes Wound exam; lower sacrum/coccyx. There is still exposed bone necrotic debris on the surface which I removed with a #3 curette very gently he has some viable tissue here however he will need to have more of this move forward to cover the bone and perhaps give him a service for epithelialization. Over the around the wound he has a rectangular rash which I suspect is due to Mepilex that he covers the dressing with. Some of this looks candidal in nature with satellite lesions. Electronic Signature(s) Signed: 10/04/2020 4:35:29 PM By: Linton Ham MD Entered By: Linton Ham on 10/04/2020 11:27:41 Cuthbert, Prescott D. ([id number]) -------------------------------------------------------------------------------- Physician Orders Details Patient Name: [name] Date of Service: 10/04/2020 9:45 AM Medical Record Number: [id number] Patient Account Number: [id number] Date of Birth/Sex: 1991/04/18 (29 y.o. M) Treating RN: Dolan Amen Primary Care Provider: Derinda Late Other Clinician: Referring Provider: Derinda Late Treating Provider/Extender:  Tito Dine in Treatment: 5 Verbal / Phone Orders: No Diagnosis Coding Follow-up Appointments o Return Appointment in 2 weeks. Bathing/ Shower/ Hygiene o Clean wound with Normal Saline or wound cleanser. Off-Loading o  Turn and reposition every 2 hours o Other: - Keep all pressure of of wounded area Additional Orders / Instructions o Other: - Lotrimin cream on redness area Wound Treatment Wound #1 - Sacrum Cleanser: Normal Saline 3 x Per Week/15 Days Discharge Instructions: Wash your hands with soap and water. Remove old dressing, discard into plastic bag and place into trash. Cleanse the wound with Normal Saline prior to applying a clean dressing using gauze sponges, not tissues or cotton balls. Do not scrub or use excessive force. Pat dry using gauze sponges, not tissue or cotton balls. Primary Dressing: Prisma 4.34 (in) 3 x Per Week/15 Days Discharge Instructions: Moisten w/normal saline or sterile water; Cover wound as directed. Do not remove from wound bed. Secondary Dressing: Foam Dressing, 4x4 (in/in) (DME) (Generic) 3 x Per Week/15 Days Secured With: Tegaderm Film Transparent 4x4.75 (in/in) (DME) (Generic) 3 x Per Week/15 Days Discharge Instructions: Secure foam Electronic Signature(s) Signed: 10/04/2020 2:34:27 PM By: Georges Mouse, Minus Breeding RN Signed: 10/04/2020 4:35:29 PM By: Linton Ham MD Previous Signature: 10/04/2020 12:09:25 PM Version By: Georges Mouse, Minus Breeding RN Entered By: Georges Mouse, Minus Breeding on 10/04/2020 14:15:57 Belton, Addam D. ([id number]) -------------------------------------------------------------------------------- Problem List Details Patient Name: [name] Date of Service: 10/04/2020 9:45 AM Medical Record Number: [id number] Patient Account Number: [id number] Date of Birth/Sex: 03/11/1991 (29 y.o. M) Treating RN: Cornell Barman Primary Care Provider: Derinda Late Other Clinician: Referring Provider: Derinda Late Treating Provider/Extender: Tito Dine in Treatment: 5 Active Problems ICD-10 Encounter Code Description Active Date MDM Diagnosis L89.154 Pressure ulcer of sacral region, stage 4 08/30/2020 No Yes E43 Unspecified severe protein-calorie malnutrition 08/30/2020 No Yes G80.9 Cerebral palsy, unspecified 08/30/2020 No Yes Inactive Problems Resolved Problems Electronic Signature(s) Signed: 10/04/2020 4:35:29 PM By: Linton Ham MD Entered By: Linton Ham on 10/04/2020 11:19:22 Wissink, Rafeal D. ([id number]) -------------------------------------------------------------------------------- Progress Note Details Patient Name: [name] Date of Service: 10/04/2020 9:45 AM Medical Record Number: [id number] Patient Account Number: [id number] Date of Birth/Sex: [birth year] (29 y.o. M) Treating RN: Cornell Barman Primary Care Provider: Derinda Late Other Clinician: Referring Provider: Derinda Late Treating Provider/Extender: Tito Dine in Treatment: 5 Subjective History of Present Illness (HPI) ADMISSION 08/30/2020 This is a difficult case of a unfortunate 30 year old man with cerebral palsy and seizures. 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 This caused quite a deterioration recently in his function he required placement of a PEG tube for feeding, he developed a significant left upper lobe aspiration driven lung met lung abscess for which she is taken Augmentin. He is nonambulatory. Nevertheless his parents were here today feel that he had ability to communicate and a good functional level up until very recently. He was hospitalized at the beginning of March for the pneumonia. During this time he was put in one of the hospital standard pressure prevention mattresses although this was  apparently meant for an adult resulting in him sliding down and it and he developed a pressure sore on his lower sacrum. 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 They have been using Santyl. Apparently the nurse practitioner for palliative care who comes to see him in the home  prescribed this Past medical history includes cerebral palsy with seizures, autoimmune encephalitis. 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 He apparently has some form of ulcerative colitis, history of candidemia, nephrolithiasis, he is tube feed dependent although they still give him something orally. Left upper lobe lung abscess followed by ID at [facility] currently on Augmentin 4/6 culture I did last week showed staph lugdunensis. X-ray did not show osteomyelitis was a poor quality film they recommended a CT scan. They have been using Santyl-based dressings. Also note that they changed the antibiotics for the lung abscess to Levaquin and Flagyl from Augmentin. The Levaquin will indeed cover the coag negative staph we cultured. He does have exposed bone however I do not feel pressured to do a CT scan at this point. It would be possible to biopsy the bone should that become necessary 4/13; patient arrives in clinic still using Santyl to his wounds. His parents report up to 9 liquid bowel movements a day over the last 5 to 6 days. They go on to tell me that he is being worked up for a small bowel source of diarrhea. He does apparently not have celiac disease. There has been some thought about malabsorption. He had colonoscopy and endoscopy. 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 Since the patient was last here he has been to infectious disease at [facility] predominantly for evaluation of the lung abscess which is resolved however apparently has aspiration pneumonitis now is going back on an additional antibiotic. Stool has been tested for C. difficile toxin and many other pathogens with everything negative. Nevertheless multiple episodes of diarrhea continue. They are also apparently going to change his tube feeding to see if that helps with the diarrhea. 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 He is approved for Grafix but I cannot really determine what co-pay has. It would appear that the maximum is to 37.75 for a 2 x 3 cm. Objective Constitutional Sitting or standing Blood Pressure is within target range for patient.. Pulse regular and within target range for patient.Marland Kitchen Respirations regular, non- labored and within target range.. Temperature is normal and within the target range for the patient.Marland Kitchen appears in no distress. Very frail young man. Vitals Time Taken: 10:04 AM, Height: 54 in, Weight: 50 lbs, BMI: 12.1, Temperature: 96.9 F, Pulse: 93 bpm, Respiratory Rate: 16 breaths/min, Blood Pressure: 122/75 mmHg. General Notes: Wound exam; lower sacrum/coccyx. There is still exposed bone necrotic debris on the surface which I removed with a #3 curette very gently he has some viable tissue here however he will need to have more of this move forward to cover the bone and perhaps give him a service for epithelialization. Over the around the wound he has a rectangular rash which I suspect is due to Mepilex that he covers the dressing with. Some of this looks candidal in nature with satellite lesions. Integumentary (Hair, Skin) Wound #1 status is Open. Original cause of wound was Gradually Appeared. The date acquired was: 08/04/2020. The wound has  been in treatment 5 weeks. The wound is located on the Sacrum. The wound measures 0.6cm length x 0.7cm width x  0.8cm depth; 0.33cm^2 area and 0.264cm^3 volume. There is Fat Layer (Subcutaneous Tissue) exposed. There is undermining starting at 11:00 and ending at 2:00 with a maximum distance of 0.9cm. There is a medium amount of serous drainage noted. There is small (1-33%) pink granulation within the wound bed. There is a large Arredondo, Petra D. ([id number]) (67-100%) amount of necrotic tissue within the wound bed including Adherent Slough. Assessment Active Problems ICD-10 Pressure ulcer of sacral region, stage 4 Unspecified severe protein-calorie malnutrition Cerebral palsy, unspecified Procedures Wound #1 Pre-procedure diagnosis of Wound #1 is a Pressure Ulcer located on the Sacrum . There was a Excisional Skin/Subcutaneous Tissue Debridement with a total area of 0.42 sq cm performed by Ricard Dillon, MD. With the following instrument(s): Curette to remove Non-Viable tissue/material. Material removed includes Subcutaneous Tissue and Slough and. A time out was conducted at 10:44, prior to the start of the procedure. A Minimum amount of bleeding was controlled with Pressure. The procedure was tolerated well. Post Debridement Measurements: 0.6cm length x 0.7cm width x 0.9cm depth; 0.297cm^3 volume. Post debridement Stage noted as Category/Stage IV. Character of Wound/Ulcer Post Debridement is stable. Post procedure Diagnosis Wound #1: Same as Pre-Procedure Plan Follow-up Appointments: Return Appointment in 2 weeks. Bathing/ Shower/ Hygiene: Clean wound with Normal Saline or wound cleanser. Off-Loading: Turn and reposition every 2 hours Other: - Keep all pressure of of wounded area Additional Orders / Instructions: Other: - Lotrimin cream on redness area WOUND #1: - Sacrum Wound Laterality: Cleanser: Normal Saline 3 x Per Week/15 Days Discharge Instructions: Wash your hands  with soap and water. Remove old dressing, discard into plastic bag and place into trash. Cleanse the wound with Normal Saline prior to applying a clean dressing using gauze sponges, not tissues or cotton balls. Do not scrub or use excessive force. Pat dry using gauze sponges, not tissue or cotton balls. Primary Dressing: Prisma 4.34 (in) 3 x Per Week/15 Days Discharge Instructions: Moisten w/normal saline or sterile water; Cover wound as directed. Do not remove from wound bed. Secondary Dressing: Foam Dressing, 4x4 (in/in) (DME) (Generic) 3 x Per Week/15 Days Secured With: Tegaderm Film 4x4 (in/in) 3 x Per Week/15 Days Discharge Instructions: Apply to wound bed #1 continue with the silver collagen 2. I would be prepared to order the Grafix if this is affordable. I will have her practice administrator call the father. 3. Continues to have copious diarrhea although they have recently changed his feeding I believe to Jevity 4. We have change the external dressing covered to Tegaderm instead of Mepilex. I have asked him to use Lotrimin over-the-counter to the rash in this area although I think this is a contact dermatitis Electronic Signature(s) Signed: 10/04/2020 4:35:29 PM By: Linton Ham MD Entered By: Linton Ham on 10/04/2020 11:31:05 Much, Verbon D. ([id number]) Bogart, Johnnie D. ([id number]) -------------------------------------------------------------------------------- SuperBill Details Patient Name: [name] Date of Service: 10/04/2020 Medical Record Number: [id number] Patient Account Number: [id number] Date of Birth/Sex: February 02, 1991 (29 y.o. M) Treating RN: Cornell Barman Primary Care Provider: Derinda Late Other Clinician: Referring Provider: Derinda Late Treating Provider/Extender: Tito Dine in Treatment: 5 Diagnosis Coding ICD-10 Codes Code Description L89.154 Pressure ulcer of sacral region, stage 4 E43 Unspecified severe  protein-calorie malnutrition G80.9 Cerebral palsy, unspecified Facility Procedures CPT4 Code: [id number] Description: 03546 - DEB SUBQ TISSUE 20 SQ CM/< Modifier: Quantity: 1 CPT4 Code: Description: ICD-10 Diagnosis Description L89.154 Pressure ulcer of sacral region, stage 4 Modifier: Quantity: Physician Procedures CPT4 Code: [id number] Description:  11042 - WC PHYS SUBQ TISS 20 SQ CM Modifier: Quantity: 1 CPT4 Code: Description: ICD-10 Diagnosis Description L89.154 Pressure ulcer of sacral region, stage 4 Modifier: Quantity: Electronic Signature(s) Signed: 10/04/2020 4:35:29 PM By: Linton Ham MD Entered By: Linton Ham on 10/04/2020 11:31:19

## 2020-10-18 ENCOUNTER — Other Ambulatory Visit: Payer: Self-pay

## 2020-10-18 ENCOUNTER — Encounter: Payer: PRIVATE HEALTH INSURANCE | Admitting: Internal Medicine

## 2020-10-18 DIAGNOSIS — E11622 Type 2 diabetes mellitus with other skin ulcer: Secondary | ICD-10-CM | POA: Diagnosis not present

## 2020-10-19 NOTE — Progress Notes (Signed)
TEAGON, KRON ([id number]) Visit Report for 10/18/2020 HPI Details Patient Name: [name], [name] Date of Service: 10/18/2020 10:45 AM Medical Record Number: [id number] Patient Account Number: [id number] Date of Birth/Sex: 1990-11-21 (29 y.o. M) Treating RN: Dolan Amen Primary Care Provider: Derinda Late Other Clinician: Referring Provider: Derinda Late Treating Provider/Extender: Tito Dine in Treatment: 7 History of Present Illness HPI Description: [name] 08/30/2020 This is a difficult case of a unfortunate 30 year old man with cerebral palsy and seizures. 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 This caused quite a deterioration recently in his function he required placement of a PEG tube for feeding, he developed a significant left upper lobe aspiration driven lung met lung abscess for which she is taken Augmentin. He is nonambulatory. Nevertheless his parents were here today feel that he had ability to communicate and a good functional level up until very recently. He was hospitalized at the beginning of March for the pneumonia. During this time he was put in one of the hospital standard pressure prevention mattresses although this was apparently meant for an adult resulting in him sliding down and it and he developed a pressure sore on his lower sacrum. 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 They have been using Santyl. Apparently the nurse practitioner for palliative care who comes to see him in the home prescribed this Past medical history includes cerebral palsy with seizures, autoimmune encephalitis. He is a diabetic most recently listed in  the [place] records as type I based on low protein C levels and an anti-GAD antibody however his parents state that this has recently been called into question however I did not delve into this further. He apparently has some form of ulcerative colitis, history of candidemia, nephrolithiasis, he is tube feed dependent although they still give him something orally. Left upper lobe lung abscess followed by ID at [facility] currently on Augmentin 4/6 culture I did last week showed staph lugdunensis. X-ray did not show osteomyelitis was a poor quality film they recommended a CT scan. They have been using Santyl-based dressings. Also note that they changed the antibiotics for the lung abscess to Levaquin and Flagyl from Augmentin. The Levaquin will indeed cover the coag negative staph we cultured. He does have exposed bone however I do not feel pressured to do a CT scan at this point. It would be possible to biopsy the bone should that become necessary 4/13; patient arrives in clinic still using Santyl to his wounds. His parents report up to 9 liquid bowel movements a day over the last 5 to 6 days. They go on to tell me that he is being worked up for a small bowel source of diarrhea. He does apparently not have celiac disease. There has been some thought about malabsorption. He had colonoscopy and endoscopy. 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 Since the patient was last here he has been to infectious disease at [facility] predominantly for evaluation of the lung abscess which is resolved however apparently has aspiration pneumonitis now is going back on an additional antibiotic. Stool has  been tested for C. difficile toxin and many other pathogens with everything negative. Nevertheless multiple episodes of diarrhea continue. They are also apparently going to change his tube feeding to see if that helps with  the diarrhea. 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 He is approved for Grafix but I cannot really determine what co-pay has. It would appear that the maximum is to 37.75 for a 2 x 3 cm. 5/22; 2-week follow-up he was denied for I believe Grafix in spite of what I said 2 weeks ago. The wound is about the same. Small orifice tunneling from about 2-8 o'clock. The bone is actually superior. No evidence of infection. Electronic Signature(s) Signed: 10/18/2020 5:17:14 PM By: Linton Ham MD Entered By: Linton Ham on 10/18/2020 12:23:23 [name], [name] ([id number]) -------------------------------------------------------------------------------- Otelia Sergeant TISS Details Patient Name: [name] Date of Service: 10/18/2020 10:45 AM Medical Record Number: [id number] Patient Account Number: [id number] Date of Birth/Sex: 06/24/1990 (29 y.o. M) Treating RN: Dolan Amen Primary Care Provider: Derinda Late Other Clinician: Referring Provider: Derinda Late Treating Provider/Extender: Tito Dine in Treatment: 7 Procedure Performed for: Wound #1 Sacrum Performed By: Physician Ricard Dillon, MD Post Procedure Diagnosis Same as Pre-procedure Notes MD used 2 silver nitrate sticks Electronic Signature(s) Signed: 10/18/2020 5:17:14 PM By: Linton Ham MD Entered By: Linton Ham on 10/18/2020 12:22:14 [name], [name] ([id number]) -------------------------------------------------------------------------------- Physical Exam Details Patient Name: [name] Date of Service: 10/18/2020 10:45 AM Medical Record Number: [id number] Patient Account Number: [id number] Date of Birth/Sex: Mar 22, 1991 (29 y.o. M) Treating RN: Dolan Amen Primary Care Provider: Derinda Late Other Clinician: Referring Provider: Derinda Late Treating Provider/Extender: Tito Dine in  Treatment: 7 Constitutional Sitting or standing Blood Pressure is within target range for patient.. Pulse regular and within target range for patient.Marland Kitchen Respirations regular, non- labored and within target range.. Temperature is normal and within the target range for the patient.Marland Kitchen appears in no distress. Notes Wound exam; he had protruding granulation tissue without an adherent base I knocked this back with silver nitrate. Otherwise the wound is essentially unchanged there is some reasonably healthy looking granulation from about 3-10 o'clock the bone is at 12:00. He has some irritation of the skin around the wound although this looks a lot better than last time Electronic Signature(s) Signed: 10/18/2020 5:17:14 PM By: Linton Ham MD Entered By: Linton Ham on 10/18/2020 12:24:44 [name], [name] ([id number]) -------------------------------------------------------------------------------- Physician Orders Details Patient Name: [name] Date of Service: 10/18/2020 10:45 AM Medical Record Number: [id number] Patient Account Number: [id number] Date of Birth/Sex: [birth year] (29 y.o. M) Treating RN: Dolan Amen Primary Care Provider: Derinda Late Other Clinician: Referring Provider: Derinda Late Treating Provider/Extender: Tito Dine in Treatment: 7 Verbal / Phone Orders: No Diagnosis Coding Follow-up Appointments o Return Appointment in 2 weeks. Bathing/ Shower/ Hygiene o Clean wound with Normal Saline or wound cleanser. Off-Loading o Turn and reposition every 2 hours o Other: - Keep all pressure of of wounded area Additional Orders / Instructions o Other: - Lotrimin cream on redness area Wound Treatment Wound #1 - Sacrum Cleanser: Normal Saline 3 x Per Week/15 Days Discharge Instructions: Wash your hands with soap and water. Remove old dressing, discard into plastic bag and place into trash. Cleanse the wound with Normal Saline  prior to applying a clean dressing using gauze sponges, not tissues or cotton balls. Do not scrub or use excessive force.  Pat dry using gauze sponges, not tissue or cotton balls. Primary Dressing: Prisma 4.34 (in) 3 x Per Week/15 Days Discharge Instructions: Moisten with hydrogel and place on wound bed Secondary Dressing: Mepilex Border Flex, 4x4 (in/in) 3 x Per Week/15 Days Discharge Instructions: Apply to wound as directed. Do not cut. Electronic Signature(s) Signed: 10/18/2020 12:48:14 PM By: Georges Mouse, Minus Breeding RN Signed: 10/18/2020 5:17:14 PM By: Linton Ham MD Entered By: Georges Mouse, Minus Breeding on 10/18/2020 11:29:53 [name], [name] ([id number]) -------------------------------------------------------------------------------- Problem List Details Patient Name: [name] Date of Service: 10/18/2020 10:45 AM Medical Record Number: [id number] Patient Account Number: [id number] Date of Birth/Sex: 1991/01/06 (29 y.o. M) Treating RN: Dolan Amen Primary Care Provider: Derinda Late Other Clinician: Referring Provider: Derinda Late Treating Provider/Extender: Tito Dine in Treatment: 7 Active Problems ICD-10 Encounter Code Description Active Date MDM Diagnosis L89.154 Pressure ulcer of sacral region, stage 4 08/30/2020 No Yes E43 Unspecified severe protein-calorie malnutrition 08/30/2020 No Yes G80.9 Cerebral palsy, unspecified 08/30/2020 No Yes Inactive Problems Resolved Problems Electronic Signature(s) Signed: 10/18/2020 5:17:14 PM By: Linton Ham MD Entered By: Linton Ham on 10/18/2020 12:21:57 [name], [name] ([id number]) -------------------------------------------------------------------------------- Progress Note Details Patient Name: [name] Date of Service: 10/18/2020 10:45 AM Medical Record Number: [id number] Patient Account Number: [id number] Date of Birth/Sex: Feb 12, 1991 (29 y.o. M) Treating RN: Dolan Amen Primary Care Provider: Derinda Late Other Clinician: Referring Provider: Derinda Late Treating Provider/Extender: Tito Dine in Treatment: 7 Subjective History of Present Illness (HPI) TeensADMISSION 08/30/2020 This is a difficult case of a unfortunate 30 year old man with cerebral palsy and seizures. 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 This caused quite a deterioration recently in his function he required placement of a PEG tube for feeding, he developed a significant left upper lobe aspiration driven lung met lung abscess for which she is taken Augmentin. He is nonambulatory. Nevertheless his parents were here today feel that he had ability to communicate and a good functional level up until very recently. He was hospitalized at the beginning of March for the pneumonia. During this time he was put in one of the hospital standard pressure prevention mattresses although this was apparently meant for an adult resulting in him sliding down and it and he developed a pressure sore on his lower sacrum. 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 They have been using Santyl. Apparently the nurse practitioner for palliative care who comes to see him in the home prescribed this Past medical history includes cerebral palsy with seizures, autoimmune encephalitis. He is a diabetic most recently listed in the [facility] records as type I based on low protein C levels and an anti-GAD antibody however his parents state that this has recently been called into question however I did not delve into this further. He apparently has some form of ulcerative colitis, history of candidemia,  nephrolithiasis, he is tube feed dependent although they still give him something orally. Left upper lobe lung abscess followed by ID at [facility] currently on Augmentin 4/6 culture I did last week showed staph lugdunensis. X-ray did not show osteomyelitis was a poor quality film they recommended a CT scan.  They have been using Santyl-based dressings. Also note that they changed the antibiotics for the lung abscess to Levaquin and Flagyl from Augmentin. The Levaquin will indeed cover the coag negative staph we cultured. He does have exposed bone however I do not feel pressured to do a CT scan at this point. It would be possible to biopsy the bone should that become necessary 4/13; patient arrives in clinic still using Santyl to his wounds. His parents report up to 9 liquid bowel movements a day over the last 5 to 6 days. They go on to tell me that he is being worked up for a small bowel source of diarrhea. He does apparently not have celiac disease. There has been some thought about malabsorption. He had colonoscopy and endoscopy. 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 Since the patient was last here he has been to infectious disease at [facility] predominantly for evaluation of the lung abscess which is resolved however apparently has aspiration pneumonitis now is going back on an additional antibiotic. Stool has been tested for C. difficile toxin and many other pathogens with everything negative. Nevertheless multiple episodes of diarrhea continue. They are also apparently going to change his tube feeding to see if that helps with the diarrhea. 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 He is approved for Grafix but I cannot really determine what co-pay has. It would appear that the maximum is to 37.75 for a 2 x 3 cm. 5/22; 2-week follow-up he was denied for I  believe Grafix in spite of what I said 2 weeks ago. The wound is about the same. Small orifice tunneling from about 2-8 o'clock. The bone is actually superior. No evidence of infection. Objective Constitutional Sitting or standing Blood Pressure is within target range for patient.. Pulse regular and within target range for patient.Marland Kitchen Respirations regular, non- labored and within target range.. Temperature is normal and within the target range for the patient.Marland Kitchen appears in no distress. Vitals Time Taken: 10:55 AM, Height: 54 in, Weight: 50 lbs, BMI: 12.1, Pulse: 107 bpm, Respiratory Rate: 16 breaths/min, Blood Pressure: 126/88 mmHg. General Notes: Wound exam; he had protruding granulation tissue without an adherent base I knocked this back with silver nitrate. Otherwise the wound is essentially unchanged there is some reasonably healthy looking granulation from about 3-10 o'clock the bone is at 12:00. He has some irritation of the skin around the wound although this looks a lot better than last time Integumentary (Hair, Skin) Wound #1 status is Open. Original cause of wound was Gradually Appeared. The date acquired was: 08/04/2020. The wound has been in treatment 7 weeks. The wound is located on the Sacrum. The wound measures 0.8cm length x 0.6cm width x 0.8cm depth; 0.377cm^2 area and 0.302cm^3 volume. There is Fat Layer (Subcutaneous Tissue) exposed. There is no tunneling noted, however, there is undermining starting at 12:00 and ending at 12:00 with a maximum distance of 1cm. There is a medium amount of serous drainage noted. There is medium (34-66%) [name], [name] ([id number]) pink granulation within the wound bed. There is a medium (34-66%) amount of necrotic tissue within the wound bed including Adherent Slough. Assessment Active Problems ICD-10 Pressure ulcer of sacral region, stage 4 Unspecified severe protein-calorie malnutrition Cerebral palsy, unspecified Procedures Wound  #1 Pre-procedure diagnosis of Wound #1 is a Pressure Ulcer located on the Sacrum .  An CHEM CAUT GRANULATION TISS procedure was performed by Ricard Dillon, MD. Post procedure Diagnosis Wound #1: Same as Pre-Procedure Notes: MD used 2 silver nitrate sticks Plan Follow-up Appointments: Return Appointment in 2 weeks. Bathing/ Shower/ Hygiene: Clean wound with Normal Saline or wound cleanser. Off-Loading: Turn and reposition every 2 hours Other: - Keep all pressure of of wounded area Additional Orders / Instructions: Other: - Lotrimin cream on redness area WOUND #1: - Sacrum Wound Laterality: Cleanser: Normal Saline 3 x Per Week/15 Days Discharge Instructions: Wash your hands with soap and water. Remove old dressing, discard into plastic bag and place into trash. Cleanse the wound with Normal Saline prior to applying a clean dressing using gauze sponges, not tissues or cotton balls. Do not scrub or use excessive force. Pat dry using gauze sponges, not tissue or cotton balls. Primary Dressing: Prisma 4.34 (in) 3 x Per Week/15 Days Discharge Instructions: Moisten with hydrogel and place on wound bed Secondary Dressing: Mepilex Border Flex, 4x4 (in/in) 3 x Per Week/15 Days Discharge Instructions: Apply to wound as directed. Do not cut. 1. Continue with the silver collagen 2. I asked the family to call their insurance and see what the appeal process is. They were denied on the basis of lack of medical necessity however his stage IV wound obviously has some degree of medical urgency 3. Fortunately he does not seem to have any evidence of infection. His antibiotics were stopped 2 weeks ago by his pediatric infectious disease doctor at [facility]4 he is totally PEG tube dependent apparently aspirates with any p.o.'s Electronic Signature(s) Signed: 10/18/2020 5:17:14 PM By: Linton Ham MD Entered By: Linton Ham on 10/18/2020 12:31:03 [name], [name]  ([id number]) -------------------------------------------------------------------------------- SuperBill Details Patient Name: [name] Date of Service: 10/18/2020 Medical Record Number: [id number] Patient Account Number: [id number] Date of Birth/Sex: 1990/09/21 (29 y.o. M) Treating RN: Dolan Amen Primary Care Provider: Derinda Late Other Clinician: Referring Provider: Derinda Late Treating Provider/Extender: Tito Dine in Treatment: 7 Diagnosis Coding ICD-10 Codes Code Description L89.154 Pressure ulcer of sacral region, stage 4 E43 Unspecified severe protein-calorie malnutrition G80.9 Cerebral palsy, unspecified Facility Procedures CPT4 Code: [id number] Description: 43912 - CHEM CAUT GRANULATION TISS Modifier: Quantity: 1 CPT4 Code: Description: ICD-10 Diagnosis Description L89.154 Pressure ulcer of sacral region, stage 4 Modifier: Quantity: Physician Procedures CPT4 Code: [id number] Description: 93416 - WC PHYS CHEM CAUT GRAN TISSUE Modifier: Quantity: 1 CPT4 Code: Description: ICD-10 Diagnosis Description L89.154 Pressure ulcer of sacral region, stage 4 Modifier: Quantity: Electronic Signature(s) Signed: 10/18/2020 5:17:14 PM By: Linton Ham MD Entered By: Linton Ham on 10/18/2020 12:31:20

## 2020-10-19 NOTE — Progress Notes (Signed)
[name], [name] ([id number]) Visit Report for 10/18/2020 Arrival Information Details Patient Name: [name], [name] Date of Service: 10/18/2020 10:45 AM Medical Record Number: [id number] Patient Account Number: [id number] Date of Birth/Sex: 09-18-1990 (29 y.o. M) Treating RN: Hansel FeinsteinBishop, Joy Primary Care Amada Hallisey: Kandyce RudBabaoff, Marcus Other Clinician: Referring Caty Tessler: Kandyce RudBabaoff, Marcus Treating Kaely Hollan/Extender: Altamese CarolinaOBSON, MICHAEL G Weeks in Treatment: 7 Visit Information History Since Last Visit Added or deleted any medications: No Patient Arrived: Wheel Chair Had a fall or experienced change in No Arrival Time: 10:56 activities of daily living that may affect Accompanied By: parents risk of falls: Transfer Assistance: Manual Hospitalized since last visit: No Patient Identification Verified: Yes Has Dressing in Place as Prescribed: Yes Secondary Verification Process Completed: Yes Pain Present Now: Unable to Respond Patient Requires Transmission-Based Precautions: No Patient Has Alerts: No Electronic Signature(s) Signed: 10/19/2020 8:26:01 AM By: Hansel FeinsteinBishop, Joy Entered By: Hansel FeinsteinBishop, Joy on 10/18/2020 10:56:44 [name], [name] ([id number]) -------------------------------------------------------------------------------- Clinic Level of Care Assessment Details Patient Name: [name], [name] Date of Service: 10/18/2020 10:45 AM Medical Record Number: [id number] Patient Account Number: [id number] Date of Birth/Sex: 09-18-1990 (29 y.o. M) Treating RN: Rogers BlockerSanchez, Kenia Primary Care Brigitta Pricer: Kandyce RudBabaoff, Marcus Other Clinician: Referring Launi Asencio: Kandyce RudBabaoff, Marcus Treating Cerissa Zeiger/Extender: Altamese CarolinaOBSON, MICHAEL G Weeks in Treatment: 7 Clinic Level of Care Assessment Items TOOL 1 Quantity Score []  - Use when EandM and Procedure is performed on INITIAL visit 0 ASSESSMENTS - Nursing Assessment / Reassessment []  - General Physical Exam (combine w/ comprehensive assessment (listed just below)  when performed on new 0 pt. evals) []  - 0 Comprehensive Assessment (HX, ROS, Risk Assessments, Wounds Hx, etc.) ASSESSMENTS - Wound and Skin Assessment / Reassessment []  - Dermatologic / Skin Assessment (not related to wound area) 0 ASSESSMENTS - Ostomy and/or Continence Assessment and Care []  - Incontinence Assessment and Management 0 []  - 0 Ostomy Care Assessment and Management (repouching, etc.) PROCESS - Coordination of Care []  - Simple Patient / Family Education for ongoing care 0 []  - 0 Complex (extensive) Patient / Family Education for ongoing care []  - 0 Staff obtains ChiropractorConsents, Records, Test Results / Process Orders []  - 0 Staff telephones HHA, Nursing Homes / Clarify orders / etc []  - 0 Routine Transfer to another Facility (non-emergent condition) []  - 0 Routine Hospital Admission (non-emergent condition) []  - 0 New Admissions / Manufacturing engineernsurance Authorizations / Ordering NPWT, Apligraf, etc. []  - 0 Emergency Hospital Admission (emergent condition) PROCESS - Special Needs []  - Pediatric / Minor Patient Management 0 []  - 0 Isolation Patient Management []  - 0 Hearing / Language / Visual special needs []  - 0 Assessment of Community assistance (transportation, D/C planning, etc.) []  - 0 Additional assistance / Altered mentation []  - 0 Support Surface(s) Assessment (bed, cushion, seat, etc.) INTERVENTIONS - Miscellaneous []  - External ear exam 0 []  - 0 Patient Transfer (multiple staff / Nurse, adultHoyer Lift / Similar devices) []  - 0 Simple Staple / Suture removal (25 or less) []  - 0 Complex Staple / Suture removal (26 or more) []  - 0 Hypo/Hyperglycemic Management (do not check if billed separately) []  - 0 Ankle / Brachial Index (ABI) - do not check if billed separately Has the patient been seen at the hospital within the last three years: Yes Total Score: 0 Level Of Care: ____ [name], [name] ([id number]) Electronic Signature(s) Signed: 10/18/2020 12:48:14 PM By: Phillis HaggisSanchez  Pereyda, Dondra PraderKenia RN Entered By: Phillis HaggisSanchez Pereyda, Dondra PraderKenia on 10/18/2020 11:30:00 [name], [name] ([id number]) -------------------------------------------------------------------------------- Encounter Discharge Information Details Patient Name: [name], Joshua  D. Date of Service: 10/18/2020 10:45 AM Medical Record Number: [id number] Patient Account Number: [id number] Date of Birth/Sex: 02/01/1991 (29 y.o. M) Treating RN: Rogers Blocker Primary Care Patricio Popwell: Kandyce Rud Other Clinician: Referring Lindyn Vossler: Kandyce Rud Treating Jennetta Flood/Extender: Altamese [place] in Treatment: 7 Encounter Discharge Information Items Discharge Condition: Stable Ambulatory Status: Wheelchair Discharge Destination: Home Transportation: Private Auto Accompanied By: parents Schedule Follow-up Appointment: Yes Clinical Summary of Care: Electronic Signature(s) Signed: 10/19/2020 5:05:23 PM By: Lolita Cram Entered By: Lolita Cram on 10/18/2020 11:47:57 [name], [name] ([id number]) -------------------------------------------------------------------------------- Lower Extremity Assessment Details Patient Name: Joshua Hora Date of Service: 10/18/2020 10:45 AM Medical Record Number: [id number] Patient Account Number: [id number] Date of Birth/Sex: 04-10-1991 (29 y.o. M) Treating RN: Hansel Feinstein Primary Care Emmaclaire Switala: Kandyce Rud Other Clinician: Referring Kayleena Eke: Kandyce Rud Treating Carlyn Lemke/Extender: Altamese [place] in Treatment: 7 Electronic Signature(s) Signed: 10/19/2020 8:26:01 AM By: Hansel Feinstein Entered By: Hansel Feinstein on 10/18/2020 11:05:16 [name], [name] ([id number]) -------------------------------------------------------------------------------- Multi Wound Chart Details Patient Name: Joshua Hora Date of Service: 10/18/2020 10:45 AM Medical Record Number: [id number] Patient Account Number: [id number] Date of Birth/Sex:  [birth year] (29 y.o. M) Treating RN: Rogers Blocker Primary Care Lacara Dunsworth: Kandyce Rud Other Clinician: Referring Senai Kingsley: Kandyce Rud Treating Sophiarose Eades/Extender: Altamese [place] in Treatment: 7 Vital Signs Height(in): 54 Pulse(bpm): 107 Weight(lbs): 50 Blood Pressure(mmHg): 126/88 Body Mass Index(BMI): 12 Temperature(F): Respiratory Rate(breaths/min): 16 Photos: [N/A:N/A] Wound Location: Sacrum N/A N/A Wounding Event: Gradually Appeared N/A N/A Primary Etiology: Pressure Ulcer N/A N/A Comorbid History: Type II Diabetes N/A N/A Date Acquired: 08/04/2020 N/A N/A Weeks of Treatment: 7 N/A N/A Wound Status: Open N/A N/A Measurements L x W x D (cm) 0.8x0.6x0.8 N/A N/A Area (cm) : 0.377 N/A N/A Volume (cm) : 0.302 N/A N/A % Reduction in Area: 82.90% N/A N/A % Reduction in Volume: 88.60% N/A N/A Starting Position 1 (o'clock): 12 Ending Position 1 (o'clock): 12 Maximum Distance 1 (cm): 1 Undermining: Yes N/A N/A Classification: Category/Stage IV N/A N/A Exudate Amount: Medium N/A N/A Exudate Type: Serous N/A N/A Exudate Color: amber N/A N/A Granulation Amount: Medium (34-66%) N/A N/A Granulation Quality: Pink N/A N/A Necrotic Amount: Medium (34-66%) N/A N/A Exposed Structures: Fat Layer (Subcutaneous Tissue): N/A N/A Yes Fascia: No Tendon: No Muscle: No Joint: No Bone: No Epithelialization: None N/A N/A Procedures Performed: CHEM CAUT GRANULATION TISS N/A N/A Treatment Notes Wound #1 (Sacrum) Cleanser Normal Saline Discharge Instruction: Wash your hands with soap and water. Remove old dressing, discard into plastic bag and place into trash. Cleanse the wound with Normal Saline prior to applying a clean dressing using gauze sponges, not tissues or cotton balls. Do not Nations, Bruce D. ([id number]) scrub or use excessive force. Pat dry using gauze sponges, not tissue or cotton balls. Peri-Wound Care Topical Primary Dressing Prisma 4.34  (in) Discharge Instruction: Moisten with hydrogel and place on wound bed Secondary Dressing Mepilex Border Flex, 4x4 (in/in) Discharge Instruction: Apply to wound as directed. Do not cut. Secured With Compression Wrap Compression Stockings Facilities manager) Signed: 10/18/2020 5:17:14 PM By: Baltazar Najjar MD Entered By: Baltazar Najjar on 10/18/2020 12:22:05 [name], [name] ([id number]) -------------------------------------------------------------------------------- Multi-Disciplinary Care Plan Details Patient Name: Joshua Hora Date of Service: 10/18/2020 10:45 AM Medical Record Number: [id number] Patient Account Number: [id number] Date of Birth/Sex: 1991/04/13 (29 y.o. M) Treating RN: Rogers Blocker Primary Care Teandre Hamre: Kandyce Rud Other Clinician: Referring Ridley Dileo: Kandyce Rud Treating Tyra Michelle/Extender: Altamese [place] in Treatment: 7 Active Inactive Necrotic Tissue  Nursing Diagnoses: Impaired tissue integrity related to necrotic/devitalized tissue Goals: Necrotic/devitalized tissue will be minimized in the wound bed Date Initiated: 08/30/2020 Target Resolution Date: 09/06/2020 Goal Status: Active Interventions: Assess patient pain level pre-, during and post procedure and prior to discharge Treatment Activities: Enzymatic debridement : 08/30/2020 Notes: Pressure Nursing Diagnoses: Knowledge deficit related to management of pressures ulcers Potential for impaired tissue integrity related to pressure, friction, moisture, and shear Goals: Patient will remain free from development of additional pressure ulcers Date Initiated: 08/30/2020 Target Resolution Date: 09/20/2020 Goal Status: Active Patient/caregiver will verbalize understanding of pressure ulcer management Date Initiated: 08/30/2020 Target Resolution Date: 09/20/2020 Goal Status: Active Interventions: Provide education on pressure ulcers Notes: Wound/Skin  Impairment Nursing Diagnoses: Knowledge deficit related to ulceration/compromised skin integrity Goals: Patient/caregiver will verbalize understanding of skin care regimen Date Initiated: 08/30/2020 Target Resolution Date: 09/06/2020 Goal Status: Active Ulcer/skin breakdown will have a volume reduction of 30% by week 4 Date Initiated: 08/30/2020 Target Resolution Date: 09/30/2020 Goal Status: Active Interventions: Assess ulceration(s) every visit Provide education on ulcer and skin care Dentler, [name] ([id number]) Treatment Activities: Referred to DME Gabriellia Rempel for dressing supplies : 08/30/2020 Skin care regimen initiated : 08/30/2020 Topical wound management initiated : 08/30/2020 Notes: Electronic Signature(s) Signed: 10/18/2020 12:48:14 PM By: Phillis Haggis, Dondra Prader RN Entered By: Phillis Haggis, Dondra Prader on 10/18/2020 11:20:36 [name], [name] ([id number]) -------------------------------------------------------------------------------- Pain Assessment Details Patient Name: Joshua Hora Date of Service: 10/18/2020 10:45 AM Medical Record Number: [id number] Patient Account Number: [id number] Date of Birth/Sex: 06/06/1990 (29 y.o. M) Treating RN: Hansel Feinstein Primary Care Rachelanne Whidby: Kandyce Rud Other Clinician: Referring Djuan Talton: Kandyce Rud Treating Juanya Villavicencio/Extender: Altamese [place] in Treatment: 7 Active Problems Location of Pain Severity and Description of Pain Patient Has Paino Patient Unable to Respond Site Locations Pain Management and Medication Current Pain Management: Electronic Signature(s) Signed: 10/19/2020 8:26:01 AM By: Hansel Feinstein Entered By: Hansel Feinstein on 10/18/2020 10:57:40 [name], [name] ([id number]) -------------------------------------------------------------------------------- Patient/Caregiver Education Details Patient Name: Joshua Hora Date of Service: 10/18/2020 10:45 AM Medical Record Number:  [id number] Patient Account Number: [id number] Date of Birth/Gender: [birth year] (29 y.o. M) Treating RN: Rogers Blocker Primary Care Physician: Kandyce Rud Other Clinician: Referring Physician: Kandyce Rud Treating Physician/Extender: Altamese [place] in Treatment: 7 Education Assessment Education Provided To: Patient Education Topics Provided Wound/Skin Impairment: Methods: Explain/Verbal Responses: State content correctly Electronic Signature(s) Signed: 10/18/2020 12:48:14 PM By: Phillis Haggis, Dondra Prader RN Entered By: Phillis Haggis, Dondra Prader on 10/18/2020 11:30:19 Antunes, Jerrion D. ([id number]) -------------------------------------------------------------------------------- Wound Assessment Details Patient Name: Joshua Hora Date of Service: 10/18/2020 10:45 AM Medical Record Number: [id number] Patient Account Number: [id number] Date of Birth/Sex: [birth year] (29 y.o. M) Treating RN: Hansel Feinstein Primary Care Jett Kulzer: Kandyce Rud Other Clinician: Referring Jaylnn Ullery: Kandyce Rud Treating Blayklee Mable/Extender: Altamese [place] in Treatment: 7 Wound Status Wound Number: 1 Primary Etiology: Pressure Ulcer Wound Location: Sacrum Wound Status: Open Wounding Event: Gradually Appeared Comorbid History: Type II Diabetes Date Acquired: 08/04/2020 Weeks Of Treatment: 7 Clustered Wound: No Photos Wound Measurements Length: (cm) 0.8 % Reduct Width: (cm) 0.6 % Reduct Depth: (cm) 0.8 Epitheli Area: (cm) 0.377 Tunneli Volume: (cm) 0.302 Undermi Start Endin Maxim ion in Area: 82.9% ion in Volume: 88.6% alization: None ng: No ning: Yes ing Position (o'clock): 12 g Position (o'clock): 12 um Distance: (cm) 1 Wound Description Classification: Category/Stage IV Foul Odo Exudate Amount: Medium Slough/F Exudate Type: Serous Exudate Color: amber r After Cleansing: No ibrino Yes Wound Bed Granulation Amount: Medium (  34-66%) Exposed  Structure Granulation Quality: Pink Fascia Exposed: No Necrotic Amount: Medium (34-66%) Fat Layer (Subcutaneous Tissue) Exposed: Yes Necrotic Quality: Adherent Slough Tendon Exposed: No Muscle Exposed: No Joint Exposed: No Bone Exposed: No Treatment Notes Wound #1 (Sacrum) Cleanser Normal Saline Andreen, Caydin D. ([id number]) Discharge Instruction: Wash your hands with soap and water. Remove old dressing, discard into plastic bag and place into trash. Cleanse the wound with Normal Saline prior to applying a clean dressing using gauze sponges, not tissues or cotton balls. Do not scrub or use excessive force. Pat dry using gauze sponges, not tissue or cotton balls. Peri-Wound Care Topical Primary Dressing Prisma 4.34 (in) Discharge Instruction: Moisten with hydrogel and place on wound bed Secondary Dressing Mepilex Border Flex, 4x4 (in/in) Discharge Instruction: Apply to wound as directed. Do not cut. Secured With Compression Wrap Compression Stockings Add-Ons Electronic Signature(s) Signed: 10/18/2020 11:07:06 AM By: Hansel Feinstein Entered By: Hansel Feinstein on 10/18/2020 11:07:05 Victoria, Tremaine D. ([id number]) -------------------------------------------------------------------------------- Vitals Details Patient Name: Joshua Hora Date of Service: 10/18/2020 10:45 AM Medical Record Number: [id number] Patient Account Number: [id number] Date of Birth/Sex: [birth year] (29 y.o. M) Treating RN: Hansel Feinstein Primary Care Porshea Janowski: Kandyce Rud Other Clinician: Referring [place] Urieta: Kandyce Rud Treating Charnee Turnipseed/Extender: Altamese [place] in Treatment: 7 Vital Signs Time Taken: 10:55 Pulse (bpm): 107 Height (in): 54 Respiratory Rate (breaths/min): 16 Weight (lbs): 50 Blood Pressure (mmHg): 126/88 Body Mass Index (BMI): 12.1 Reference Range: 80 - 120 mg / dl Electronic Signature(s) Signed: 10/19/2020 8:26:01 AM By: Hansel Feinstein Entered ByHansel Feinstein on  10/18/2020 10:57:16

## 2020-11-01 ENCOUNTER — Other Ambulatory Visit: Payer: Self-pay

## 2020-11-01 ENCOUNTER — Encounter: Payer: PRIVATE HEALTH INSURANCE | Attending: Internal Medicine | Admitting: Internal Medicine

## 2020-11-01 DIAGNOSIS — G809 Cerebral palsy, unspecified: Secondary | ICD-10-CM | POA: Insufficient documentation

## 2020-11-01 DIAGNOSIS — Z8661 Personal history of infections of the central nervous system: Secondary | ICD-10-CM | POA: Diagnosis not present

## 2020-11-01 DIAGNOSIS — E43 Unspecified severe protein-calorie malnutrition: Secondary | ICD-10-CM | POA: Diagnosis not present

## 2020-11-01 DIAGNOSIS — L89154 Pressure ulcer of sacral region, stage 4: Secondary | ICD-10-CM | POA: Insufficient documentation

## 2020-11-01 DIAGNOSIS — E11622 Type 2 diabetes mellitus with other skin ulcer: Secondary | ICD-10-CM | POA: Diagnosis not present

## 2020-11-01 NOTE — Progress Notes (Signed)
SOLLIE, VULTAGGIO ([id number]) Visit Report for 11/01/2020 HPI Details Patient Name: [name], [name] Date of Service: 11/01/2020 2:15 PM Medical Record Number: [id number] Patient Account Number: [id number] Date of Birth/Sex: 09/23/1990 (30 y.o. M) Treating RN: Cornell Barman Primary Care Provider: Derinda Late Other Clinician: Referring Provider: Derinda Late Treating Provider/Extender: Tito Dine in Treatment: 9 History of Present Illness HPI Description: [name] 08/30/2020 This is a difficult case of a unfortunate 30 year old man with cerebral palsy and seizures. 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 This caused quite a deterioration recently in his function he required placement of a PEG tube for feeding, he developed a significant left upper lobe aspiration driven lung met lung abscess for which she is taken Augmentin. He is nonambulatory. Nevertheless his parents were here today feel that he had ability to communicate and a good functional level up until very recently. He was hospitalized at the beginning of March for the pneumonia. During this time he was put in one of the hospital standard pressure prevention mattresses although this was apparently meant for an adult resulting in him sliding down and it and he developed a pressure sore on his lower sacrum. 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 They have been using Santyl. Apparently the nurse practitioner for palliative care who comes to see him in the home prescribed this Past medical history includes cerebral palsy with seizures, autoimmune encephalitis. He is a diabetic most recently listed in the  [name] records as type I based on low protein C levels and an anti-GAD antibody however his parents state that this has recently been called into question however I did not delve into this further. He apparently has some form of ulcerative colitis, history of candidemia, nephrolithiasis, he is tube feed dependent although they still give him something orally. Left upper lobe lung abscess followed by ID at [facility] currently on Augmentin 4/6 culture I did last week showed staph lugdunensis. X-ray did not show osteomyelitis was a poor quality film they recommended a CT scan. They have been using Santyl-based dressings. Also note that they changed the antibiotics for the lung abscess to Levaquin and Flagyl from Augmentin. The Levaquin will indeed cover the coag negative staph we cultured. He does have exposed bone however I do not feel pressured to do a CT scan at this point. It would be possible to biopsy the bone should that become necessary 4/13; patient arrives in clinic still using Santyl to his wounds. His parents report up to 9 liquid bowel movements a day over the last 5 to 6 days. They go on to tell me that he is being worked up for a small bowel source of diarrhea. He does apparently not have celiac disease. There has been some thought about malabsorption. He had colonoscopy and endoscopy. 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 Since the patient was last here he has been to infectious disease at [facility] predominantly for evaluation of the lung abscess which is resolved however apparently has aspiration pneumonitis now is going back on an additional antibiotic. Stool has  been tested for C. difficile toxin and many other pathogens with everything negative. Nevertheless multiple episodes of diarrhea continue. They are also apparently going to change his tube feeding to see if that helps with the diarrhea. 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 He is approved for Grafix but I cannot really determine what co-pay has. It would appear that the maximum is to 37.75 for a 2 x 3 cm. 5/22; 2-week follow-up he was denied for I believe Grafix in spite of what I said 2 weeks ago. The wound is about the same. Small orifice tunneling from about 2-8 o'clock. The bone is actually superior. No evidence of infection. 6/1; nothing new to report on insurance and advanced treatment products. The wound is about the same there is granulation tissue. They report some bleeding after I used silver nitrate on hyper granulated tissue last time she was here. He continues to have copious amounts of diarrhea and has visibly lost weight to both myself and our staff Electronic Signature(s) Signed: 11/01/2020 5:08:05 PM By: Linton Ham MD Entered By: Linton Ham on 11/01/2020 15:51:52 Guiney, Lily D. ([id number]) -------------------------------------------------------------------------------- Physical Exam Details Patient Name: [name] Date of Service: 11/01/2020 2:15 PM Medical Record Number: [id number] Patient Account Number: [id number] Date of Birth/Sex: July 13, 1990 (30 y.o. M) Treating RN: Cornell Barman Primary Care Provider: Derinda Late Other Clinician: Referring Provider: Derinda Late Treating Provider/Extender: Tito Dine in Treatment: 9 Constitutional Sitting or standing Blood Pressure is within target range for patient.. Pulse regular and within target range for patient.Marland Kitchen Respirations regular, non- labored and within target range.. Temperature is normal and within the target range for the patient.Marland Kitchen appears in no distress. Notes Wound exam; no protruding granulation tissue here. There was some debris. I did not see anything that really required debridement. He does have palpable bone abruptly 10:00 this is not new. There is no gross  purulence or infection around the wound. The orifice of this is gotten visibly smaller but I am not certain we been able to get any more granulation tissue here. Electronic Signature(s) Signed: 11/01/2020 5:08:05 PM By: Linton Ham MD Entered By: Linton Ham on 11/01/2020 15:53:04 Sevin, Colum D. ([id number]) -------------------------------------------------------------------------------- Physician Orders Details Patient Name: [name] Date of Service: 11/01/2020 2:15 PM Medical Record Number: [id number] Patient Account Number: [id number] Date of Birth/Sex: April 06, 1991 (30 y.o. M) Treating RN: Dolan Amen Primary Care Provider: Derinda Late Other Clinician: Referring Provider: Derinda Late Treating Provider/Extender: Tito Dine in Treatment: 9 Verbal / Phone Orders: No Diagnosis Coding Follow-up Appointments o Return Appointment in 3 weeks. Bathing/ Shower/ Hygiene o Clean wound with Normal Saline or wound cleanser. Off-Loading o Turn and reposition every 2 hours o Other: - Keep all pressure of of wounded area Wound Treatment Wound #1 - Sacrum Cleanser: Normal Saline 3 x Per Week/15 Days Discharge Instructions: Wash your hands with soap and water. Remove old dressing, discard into plastic bag and place into trash. Cleanse the wound with Normal Saline prior to applying a clean dressing using gauze sponges, not tissues or cotton balls. Do not scrub or use excessive force. Pat dry using gauze sponges, not tissue or cotton balls. Primary Dressing: Prisma 4.34 (in) 3 x Per Week/15 Days Discharge Instructions: Moisten with hydrogel and place on wound bed Secondary Dressing: Mepilex Border Flex, 4x4 (in/in) 3 x Per Week/15 Days Discharge Instructions: Apply to wound as directed. Do not cut. Electronic  Signature(s) Signed: 11/01/2020 4:52:16 PM By: Georges Mouse, Minus Breeding RN Signed: 11/01/2020 5:08:05 PM By: Linton Ham MD Entered By:  Georges Mouse, Minus Breeding on 11/01/2020 15:11:42 Dement, Jacoby D. ([id number]) -------------------------------------------------------------------------------- Problem List Details Patient Name: [name] Date of Service: 11/01/2020 2:15 PM Medical Record Number: [id number] Patient Account Number: [id number] Date of Birth/Sex: 1991-05-06 (30 y.o. M) Treating RN: Cornell Barman Primary Care Provider: Derinda Late Other Clinician: Referring Provider: Derinda Late Treating Provider/Extender: Tito Dine in Treatment: 9 Active Problems ICD-10 Encounter Code Description Active Date MDM Diagnosis L89.154 Pressure ulcer of sacral region, stage 4 08/30/2020 No Yes E43 Unspecified severe protein-calorie malnutrition 08/30/2020 No Yes G80.9 Cerebral palsy, unspecified 08/30/2020 No Yes Inactive Problems Resolved Problems Electronic Signature(s) Signed: 11/01/2020 5:08:05 PM By: Linton Ham MD Entered By: Linton Ham on 11/01/2020 15:50:36 Broce, Kaulana D. ([id number]) -------------------------------------------------------------------------------- Progress Note Details Patient Name: [name] Date of Service: 11/01/2020 2:15 PM Medical Record Number: [id number] Patient Account Number: [id number] Date of Birth/Sex: 05/07/1991 (30 y.o. M) Treating RN: Cornell Barman Primary Care Provider: Derinda Late Other Clinician: Referring Provider: Derinda Late Treating Provider/Extender: Tito Dine in Treatment: 9 Subjective History of Present Illness (HPI) TeensADMISSION 08/30/2020 This is a difficult case of a unfortunate 30 year old man with cerebral palsy and seizures. 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 This caused quite a deterioration recently in his function he required  placement of a PEG tube for feeding, he developed a significant left upper lobe aspiration driven lung met lung abscess for which she is taken Augmentin. He is nonambulatory. Nevertheless his parents were here today feel that he had ability to communicate and a good functional level up until very recently. He was hospitalized at the beginning of March for the pneumonia. During this time he was put in one of the hospital standard pressure prevention mattresses although this was apparently meant for an adult resulting in him sliding down and it and he developed a pressure sore on his lower sacrum. 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 They have been using Santyl. Apparently the nurse practitioner for palliative care who comes to see him in the home prescribed this Past medical history includes cerebral palsy with seizures, autoimmune encephalitis. He is a diabetic most recently listed in the [place] records as type I based on low protein C levels and an anti-GAD antibody however his parents state that this has recently been called into question however I did not delve into this further. He apparently has some form of ulcerative colitis, history of candidemia, nephrolithiasis, he is tube feed dependent although they still give him something orally. Left upper lobe lung abscess followed by ID at [facility] currently on Augmentin 4/6 culture I did last week showed staph lugdunensis. X-ray did not show osteomyelitis was a poor quality film they recommended a CT scan. They have been using Santyl-based dressings. Also note that they changed the antibiotics for the lung abscess to Levaquin and Flagyl from Augmentin. The Levaquin will indeed cover the coag negative staph we cultured. He does have exposed bone however I do not feel pressured to do a CT scan at this point.  It  would be possible to biopsy the bone should that become necessary 4/13; patient arrives in clinic still using Santyl to his wounds. His parents report up to 9 liquid bowel movements a day over the last 5 to 6 days. They go on to tell me that he is being worked up for a small bowel source of diarrhea. He does apparently not have celiac disease. There has been some thought about malabsorption. He had colonoscopy and endoscopy. 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 Since the patient was last here he has been to infectious disease at [facility] predominantly for evaluation of the lung abscess which is resolved however apparently has aspiration pneumonitis now is going back on an additional antibiotic. Stool has been tested for C. difficile toxin and many other pathogens with everything negative. Nevertheless multiple episodes of diarrhea continue. They are also apparently going to change his tube feeding to see if that helps with the diarrhea. 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 He is approved for Grafix but I cannot really determine what co-pay has. It would appear that the maximum is to 37.75 for a 2 x 3 cm. 5/22; 2-week follow-up he was denied for I believe Grafix in spite of what I said 2 weeks ago. The wound is about the same. Small orifice tunneling from about 2-8 o'clock. The bone is actually superior. No evidence of infection. 6/1; nothing new to report on insurance and advanced treatment products. The wound is about the same there is granulation tissue. They report some bleeding after I used silver nitrate on hyper granulated tissue last time she was here. He continues to have copious amounts of diarrhea and has visibly lost weight to both myself and our staff Objective Constitutional Sitting or standing Blood Pressure is within target range for  patient.. Pulse regular and within target range for patient.Marland Kitchen Respirations regular, non- labored and within target range.. Temperature is normal and within the target range for the patient.Marland Kitchen appears in no distress. Vitals Time Taken: 2:25 PM, Height: 54 in, Weight: 50 lbs, BMI: 12.1, Pulse: 94 bpm, Respiratory Rate: 16 breaths/min, Blood Pressure: 112/83 mmHg. General Notes: Wound exam; no protruding granulation tissue here. There was some debris. I did not see anything that really required debridement. He does have palpable bone abruptly 10:00 this is not new. There is no gross purulence or infection around the wound. The orifice of this is gotten visibly smaller but I am not certain we been able to get any more granulation tissue here. Farrel, Tally D. ([id number]) Integumentary (Hair, Skin) Wound #1 status is Open. Original cause of wound was Gradually Appeared. The date acquired was: 08/04/2020. The wound has been in treatment 9 weeks. The wound is located on the Sacrum. The wound measures 0.5cm length x 0.6cm width x 1cm depth; 0.236cm^2 area and 0.236cm^3 volume. There is Fat Layer (Subcutaneous Tissue) exposed. There is no tunneling noted, however, there is undermining starting at 12:00 and ending at 12:00 with a maximum distance of 1cm. There is a medium amount of serous drainage noted. There is medium (34-66%) pink granulation within the wound bed. There is a medium (34-66%) amount of necrotic tissue within the wound bed including Adherent Slough. Assessment Active Problems ICD-10 Pressure ulcer of sacral region, stage 4 Unspecified severe protein-calorie malnutrition Cerebral palsy, unspecified Plan Follow-up Appointments: Return Appointment in  3 weeks. Bathing/ Shower/ Hygiene: Clean wound with Normal Saline or wound cleanser. Off-Loading: Turn and reposition every 2 hours Other: - Keep all pressure of of wounded area WOUND #1: - Sacrum Wound Laterality: Cleanser: Normal  Saline 3 x Per Week/15 Days Discharge Instructions: Wash your hands with soap and water. Remove old dressing, discard into plastic bag and place into trash. Cleanse the wound with Normal Saline prior to applying a clean dressing using gauze sponges, not tissues or cotton balls. Do not scrub or use excessive force. Pat dry using gauze sponges, not tissue or cotton balls. Primary Dressing: Prisma 4.34 (in) 3 x Per Week/15 Days Discharge Instructions: Moisten with hydrogel and place on wound bed Secondary Dressing: Mepilex Border Flex, 4x4 (in/in) 3 x Per Week/15 Days Discharge Instructions: Apply to wound as directed. Do not cut. 1. I am continuing with the silver collagen I cannot think of anything better at this point. 2. No debridement is required 3. The orifice of the wound is smaller although I do not know that we have any more viable granulation which is really what I was hoping to see. 4. No evidence of infection 5. The patient is visibly deteriorating with weight loss, cachexia,o Delirium. He is not nearly as alert as the first time I saw him. The patient's father told me that his primary doctor thinks he is dying and I am not sure that this is not correct. The big concern to the family as I do not know that they really understand what is the primary vector here whether this is his autoimmune encephalitis or progressive unrelenting severe malnutrition. Perhaps both Electronic Signature(s) Signed: 11/01/2020 5:08:05 PM By: Linton Ham MD Entered By: Linton Ham on 11/01/2020 15:55:03 Bourdeau, Nicolis D. ([id number]) -------------------------------------------------------------------------------- SuperBill Details Patient Name: [name] Date of Service: 11/01/2020 Medical Record Number: [id number] Patient Account Number: [id number] Date of Birth/Sex: June 16, 1990 (30 y.o. M) Treating RN: Dolan Amen Primary Care Provider: Derinda Late Other Clinician: Referring  Provider: Derinda Late Treating Provider/Extender: Tito Dine in Treatment: 9 Diagnosis Coding ICD-10 Codes Code Description L89.154 Pressure ulcer of sacral region, stage 4 E43 Unspecified severe protein-calorie malnutrition G80.9 Cerebral palsy, unspecified Facility Procedures CPT4 Code: [id number] Description: 99213 - WOUND CARE VISIT-LEV 3 EST PT Modifier: Quantity: 1 Physician Procedures CPT4 Code: [id number] Description: 91791 - WC PHYS LEVEL 3 - EST PT Modifier: Quantity: 1 CPT4 Code: Description: ICD-10 Diagnosis Description L89.154 Pressure ulcer of sacral region, stage 4 E43 Unspecified severe protein-calorie malnutrition Modifier: Quantity: Electronic Signature(s) Signed: 11/01/2020 5:08:05 PM By: Linton Ham MD Entered By: Linton Ham on 11/01/2020 15:55:25

## 2020-11-02 ENCOUNTER — Other Ambulatory Visit: Payer: No Typology Code available for payment source | Admitting: Primary Care

## 2020-11-02 DIAGNOSIS — E43 Unspecified severe protein-calorie malnutrition: Secondary | ICD-10-CM

## 2020-11-02 DIAGNOSIS — Z515 Encounter for palliative care: Secondary | ICD-10-CM

## 2020-11-02 DIAGNOSIS — L89150 Pressure ulcer of sacral region, unstageable: Secondary | ICD-10-CM

## 2020-11-02 NOTE — Progress Notes (Signed)
[name], [name] ([id number]) Visit Report for 11/01/2020 Arrival Information Details Patient Name: [name], [name] Date of Service: 11/01/2020 2:15 PM Medical Record Number: [id number] Patient Account Number: [id number] Date of Birth/Sex: [birth year] (30 y.o. M) Treating RN: Hansel Feinstein Primary Care Mubashir Mallek: Kandyce Rud Other Clinician: Referring Jeannemarie Sawaya: Kandyce Rud Treating Shiheem Corporan/Extender: Altamese [place] in Treatment: 9 Visit Information History Since Last Visit Added or deleted any medications: No Patient Arrived: Wheel Chair Had a fall or experienced change in No Arrival Time: 14:24 activities of daily living that may affect Accompanied By: parents risk of falls: Transfer Assistance: Manual Hospitalized since last visit: No Patient Identification Verified: Yes Has Dressing in Place as Prescribed: Yes Secondary Verification Process Completed: Yes Pain Present Now: Unable to Respond Patient Requires Transmission-Based Precautions: No Patient Has Alerts: Yes Patient Alerts: DIABETIC Electronic Signature(s) Signed: 11/02/2020 9:13:32 AM By: Hansel Feinstein Entered By: Hansel Feinstein on 11/01/2020 14:31:32 [name], [name] ([id number]) -------------------------------------------------------------------------------- Clinic Level of Care Assessment Details Patient Name: [name] Date of Service: 11/01/2020 2:15 PM Medical Record Number: [id number] Patient Account Number: [id number] Date of Birth/Sex: January 22, 1991 (30 y.o. M) Treating RN: Rogers Blocker Primary Care Ivie Savitt: Kandyce Rud Other Clinician: Referring Carter Kassel: Kandyce Rud Treating Aashrith Eves/Extender: Altamese [place] in Treatment: 9 Clinic Level of Care Assessment Items TOOL 4 Quantity Score X - Use when only an EandM is performed on FOLLOW-UP visit 1 0 ASSESSMENTS - Nursing Assessment / Reassessment X - Reassessment of Co-morbidities (includes updates in patient  status) 1 10 X- 1 5 Reassessment of Adherence to Treatment Plan ASSESSMENTS - Wound and Skin Assessment / Reassessment X - Simple Wound Assessment / Reassessment - one wound 1 5 []  - 0 Complex Wound Assessment / Reassessment - multiple wounds []  - 0 Dermatologic / Skin Assessment (not related to wound area) ASSESSMENTS - Focused Assessment []  - Circumferential Edema Measurements - multi extremities 0 []  - 0 Nutritional Assessment / Counseling / Intervention []  - 0 Lower Extremity Assessment (monofilament, tuning fork, pulses) []  - 0 Peripheral Arterial Disease Assessment (using hand held doppler) ASSESSMENTS - Ostomy and/or Continence Assessment and Care []  - Incontinence Assessment and Management 0 []  - 0 Ostomy Care Assessment and Management (repouching, etc.) PROCESS - Coordination of Care X - Simple Patient / Family Education for ongoing care 1 15 []  - 0 Complex (extensive) Patient / Family Education for ongoing care []  - 0 Staff obtains , Records, Test Results / Process Orders []  - 0 Staff telephones HHA, Nursing Homes / Clarify orders / etc []  - 0 Routine Transfer to another Facility (non-emergent condition) []  - 0 Routine Hospital Admission (non-emergent condition) []  - 0 New Admissions / / Ordering NPWT, Apligraf, etc. []  - 0 Emergency Hospital Admission (emergent condition) X- 1 10 Simple Discharge Coordination []  - 0 Complex (extensive) Discharge Coordination PROCESS - Special Needs []  - Pediatric / Minor Patient Management 0 []  - 0 Isolation Patient Management []  - 0 Hearing / Language / Visual special needs []  - 0 Assessment of Community assistance (transportation, D/C planning, etc.) []  - 0 Additional assistance / Altered mentation []  - 0 Support Surface(s) Assessment (bed, cushion, seat, etc.) INTERVENTIONS - Wound Cleansing / Measurement Gaal, Clemon D. ( ) X- 1 5 Simple Wound Cleansing - one  wound []  - 0 Complex Wound Cleansing - multiple wounds X- 1 5 Wound Imaging (photographs - any number of wounds) []  - 0 Wound Tracing (instead of photographs) X- 1 5 Simple Wound Measurement -  one wound []  - 0 Complex Wound Measurement - multiple wounds INTERVENTIONS - Wound Dressings []  - Small Wound Dressing one or multiple wounds 0 X- 1 15 Medium Wound Dressing one or multiple wounds []  - 0 Large Wound Dressing one or multiple wounds []  - 0 Application of Medications - topical []  - 0 Application of Medications - injection INTERVENTIONS - Miscellaneous []  - External ear exam 0 []  - 0 Specimen Collection (cultures, biopsies, blood, body fluids, etc.) []  - 0 Specimen(s) / Culture(s) sent or taken to Lab for analysis []  - 0 Patient Transfer (multiple staff / / Similar devices) []  - 0 Simple Staple / Suture removal (25 or less) []  - 0 Complex Staple / Suture removal (26 or more) []  - 0 Hypo / Hyperglycemic Management (close monitor of Blood Glucose) []  - 0 Ankle / Brachial Index (ABI) - do not check if billed separately X- 1 5 Vital Signs Has the patient been seen at the hospital within the last three years: Yes Total Score: 80 Level Of Care: New/Established - Level 3 Electronic Signature(s) Signed: 11/01/2020 4:52:16 PM By: , RN Entered By: , on 11/01/2020 15:07:32 [name], [name] ( ) -------------------------------------------------------------------------------- Encounter Discharge Information Details Patient Name: Date of Service: 11/01/2020 2:15 PM Medical Record Number: Patient Account Number: Date of Birth/Sex: Nov 04, 1990 (30 y.o. M) Treating RN: Primary Care Yaneisy Wenz: 01/01/2021 Other Clinician: Referring Farrin Shadle: Phillis Haggis Treating Paulette [place]/Extender: Dondra Prader in Treatment: 9 Encounter Discharge Information  Items Discharge Condition: Stable Ambulatory Status: Wheelchair Discharge Destination: Home Transportation: Private Auto Accompanied By: parents Schedule Follow-up Appointment: Yes Clinical Summary of Care: Electronic Signature(s) Signed: 11/01/2020 4:53:17 PM By: Dondra Prader Entered By: 01/01/2021 on 11/01/2020 15:43:13 [name], [name] ([name]) -------------------------------------------------------------------------------- Lower Extremity Assessment Details Patient Name: 01/01/2021 Date of Service: 11/01/2020 2:15 PM Medical Record Number: [id number] Patient Account Number: 10/28/1990 Date of Birth/Sex: 10/22/1990 (30 y.o. M) Treating RN: Kandyce Rud Primary Care Caryl Manas: Kandyce Rud Other Clinician: Referring Burris Matherne: Altamese [place] Treating Keamber Macfadden/Extender: 01/01/2021 Weeks in Treatment: 9 Electronic Signature(s) Signed: 11/02/2020 9:13:32 AM By: Lolita Cram Entered By: 01/01/2021 on 11/01/2020 14:31:40 [name], [name] ([name]) -------------------------------------------------------------------------------- Multi Wound Chart Details Patient Name: 01/01/2021 Date of Service: 11/01/2020 2:15 PM Medical Record Number: [id number] Patient Account Number: 10/28/1990 Date of Birth/Sex: 24-Jan-1991 (30 y.o. M) Treating RN: Kandyce Rud Primary Care Aria Jarrard: Kandyce Rud Other Clinician: Referring Mindel Friscia: Maxwell Caul Treating Saladin Petrelli/Extender: 01/02/2021 in Treatment: 9 Vital Signs Height(in): 54 Pulse(bpm): 94 Weight(lbs): 50 Blood Pressure(mmHg): 112/83 Body Mass Index(BMI): 12 Temperature(F): Respiratory Rate(breaths/min): 16 Photos: [N/A:N/A] Wound Location: Sacrum N/A N/A Wounding Event: Gradually Appeared N/A N/A Primary Etiology: Pressure Ulcer N/A N/A Comorbid History: Type II Diabetes N/A N/A Date Acquired: 08/04/2020 N/A N/A Weeks of Treatment: 9 N/A N/A Wound Status: Open N/A  N/A Measurements L x W x D (cm) 0.5x0.6x1 N/A N/A Area (cm) : 0.236 N/A N/A Volume (cm) : 0.236 N/A N/A % Reduction in Area: 89.30% N/A N/A % Reduction in Volume: 91.10% N/A N/A Starting Position 1 (o'clock): 12 Ending Position 1 (o'clock): 12 Maximum Distance 1 (cm): 1 Undermining: Yes N/A N/A Classification: Category/Stage IV N/A N/A Exudate Amount: Medium N/A N/A Exudate Type: Serous N/A N/A Exudate Color: amber N/A N/A Granulation Amount: Medium (34-66%) N/A N/A Granulation Quality: Pink N/A N/A Necrotic Amount: Medium (34-66%) N/A N/A Exposed Structures: Fat Layer (Subcutaneous  Tissue): N/A N/A Yes Fascia: No Tendon: No Muscle: No Joint: No Bone: No Epithelialization: None N/A N/A Treatment Notes Wound #1 (Sacrum) Cleanser Normal Saline Discharge Instruction: Wash your hands with soap and water. Remove old dressing, discard into plastic bag and place into trash. Cleanse the wound with Normal Saline prior to applying a clean dressing using gauze sponges, not tissues or cotton balls. Do not [name], [name] ([id number]) scrub or use excessive force. Pat dry using gauze sponges, not tissue or cotton balls. Peri-Wound Care Topical Primary Dressing Prisma 4.34 (in) Discharge Instruction: Moisten with hydrogel and place on wound bed Secondary Dressing Mepilex Border Flex, 4x4 (in/in) Discharge Instruction: Apply to wound as directed. Do not cut. Secured With Compression Wrap Compression Stockings Facilities manager) Signed: 11/01/2020 5:08:05 PM By: Baltazar Najjar MD Entered By: Baltazar Najjar on 11/01/2020 15:50:46 [name], [name] ([id number]) -------------------------------------------------------------------------------- Multi-Disciplinary Care Plan Details Patient Name: [name] Date of Service: 11/01/2020 2:15 PM Medical Record Number: [id number] Patient Account Number: [id number] Date of Birth/Sex: 06-Nov-1990 (30 y.o.  M) Treating RN: Rogers Blocker Primary Care Herschel Fleagle: Kandyce Rud Other Clinician: Referring [name] Beigel: Kandyce Rud Treating Sadler Teschner/Extender: Altamese [place] in Treatment: 9 Active Inactive Necrotic Tissue Nursing Diagnoses: Impaired tissue integrity related to necrotic/devitalized tissue Goals: Necrotic/devitalized tissue will be minimized in the wound bed Date Initiated: 08/30/2020 Target Resolution Date: 09/06/2020 Goal Status: Active Interventions: Assess patient pain level pre-, during and post procedure and prior to discharge Treatment Activities: Enzymatic debridement : 08/30/2020 Notes: Pressure Nursing Diagnoses: Knowledge deficit related to management of pressures ulcers Potential for impaired tissue integrity related to pressure, friction, moisture, and shear Goals: Patient will remain free from development of additional pressure ulcers Date Initiated: 08/30/2020 Target Resolution Date: 09/20/2020 Goal Status: Active Patient/caregiver will verbalize understanding of pressure ulcer management Date Initiated: 08/30/2020 Target Resolution Date: 09/20/2020 Goal Status: Active Interventions: Provide education on pressure ulcers Notes: Wound/Skin Impairment Nursing Diagnoses: Knowledge deficit related to ulceration/compromised skin integrity Goals: Patient/caregiver will verbalize understanding of skin care regimen Date Initiated: 08/30/2020 Target Resolution Date: 09/06/2020 Goal Status: Active Ulcer/skin breakdown will have a volume reduction of 30% by week 4 Date Initiated: 08/30/2020 Target Resolution Date: 09/30/2020 Goal Status: Active Interventions: Assess ulceration(s) every visit Provide education on ulcer and skin care [name], [name] ([id number]) Treatment Activities: Referred to DME Gerrie Castiglia for dressing supplies : 08/30/2020 Skin care regimen initiated : 08/30/2020 Topical wound management initiated : 08/30/2020 Notes: Electronic  Signature(s) Signed: 11/01/2020 4:52:16 PM By: Phillis Haggis, Dondra Prader RN Entered By: Phillis Haggis, Dondra Prader on 11/01/2020 15:00:25 [name], [name] ([id number]) -------------------------------------------------------------------------------- Pain Assessment Details Patient Name: [name] Date of Service: 11/01/2020 2:15 PM Medical Record Number: [id number] Patient Account Number: [id number] Date of Birth/Sex: [birth year] (30 y.o. M) Treating RN: Hansel Feinstein Primary Care Burch Marchuk: Kandyce Rud Other Clinician: Referring Helaina Stefano: Kandyce Rud Treating Kimia Finan/Extender: Altamese [place] in Treatment: 9 Active Problems Location of Pain Severity and Description of Pain Patient Has Paino Patient Unable to Respond Site Locations Pain Management and Medication Current Pain Management: Notes parents/caregivers state his feeding is causing discomfort Electronic Signature(s) Signed: 11/02/2020 9:13:32 AM By: Hansel Feinstein Entered By: Hansel Feinstein on 11/01/2020 14:28:56 [name], [name] ([id number]) -------------------------------------------------------------------------------- Patient/Caregiver Education Details Patient Name: [name] Date of Service: 11/01/2020 2:15 PM Medical Record Number: [id number] Patient Account Number: [id number] Date of Birth/Gender: [birth year] (30 y.o. M) Treating RN: Rogers Blocker Primary Care Physician: Kandyce Rud Other Clinician: Referring Physician: Kandyce Rud Treating Physician/Extender:  Maxwell CaulOBSON, MICHAEL G Weeks in Treatment: 9 Education Assessment Education Provided To: Patient Education Topics Provided Wound/Skin Impairment: Methods: Explain/Verbal Responses: State content correctly Electronic Signature(s) Signed: 11/01/2020 4:52:16 PM By: Phillis HaggisSanchez Pereyda, Dondra PraderKenia RN Entered By: Phillis HaggisSanchez Pereyda, Dondra PraderKenia on 11/01/2020 15:07:47 Landi, Nashaun D.  ([id number]) -------------------------------------------------------------------------------- Wound Assessment Details Patient Name: Joshua HoraMOREFIELD, Kacy D. Date of Service: 11/01/2020 2:15 PM Medical Record Number: [id number] Patient Account Number: [id number] Date of Birth/Sex: 09/03/1990 (30 y.o. M) Treating RN: Hansel FeinsteinBishop, Joy Primary Care Raheim Beutler: Kandyce RudBabaoff, Marcus Other Clinician: Referring Zanobia Griebel: Kandyce RudBabaoff, Marcus Treating Jermisha Hoffart/Extender: Altamese CarolinaOBSON, MICHAEL G Weeks in Treatment: 9 Wound Status Wound Number: 1 Primary Etiology: Pressure Ulcer Wound Location: Sacrum Wound Status: Open Wounding Event: Gradually Appeared Comorbid History: Type II Diabetes Date Acquired: 08/04/2020 Weeks Of Treatment: 9 Clustered Wound: No Photos Wound Measurements Length: (cm) 0.5 % Reduct Width: (cm) 0.6 % Reduct Depth: (cm) 1 Epitheli Area: (cm) 0.236 Tunneli Volume: (cm) 0.236 Undermi Start Endin Maxim ion in Area: 89.3% ion in Volume: 91.1% alization: None ng: No ning: Yes ing Position (o'clock): 12 g Position (o'clock): 12 um Distance: (cm) 1 Wound Description Classification: Category/Stage IV Foul Odo Exudate Amount: Medium Slough/F Exudate Type: Serous Exudate Color: amber r After Cleansing: No ibrino Yes Wound Bed Granulation Amount: Medium (34-66%) Exposed Structure Granulation Quality: Pink Fascia Exposed: No Necrotic Amount: Medium (34-66%) Fat Layer (Subcutaneous Tissue) Exposed: Yes Necrotic Quality: Adherent Slough Tendon Exposed: No Muscle Exposed: No Joint Exposed: No Bone Exposed: No Treatment Notes Wound #1 (Sacrum) Cleanser Normal Saline Brandau, Wilbon D. ([id number]) Discharge Instruction: Wash your hands with soap and water. Remove old dressing, discard into plastic bag and place into trash. Cleanse the wound with Normal Saline prior to applying a clean dressing using gauze sponges, not tissues or cotton balls. Do not scrub or use excessive force. Pat  dry using gauze sponges, not tissue or cotton balls. Peri-Wound Care Topical Primary Dressing Prisma 4.34 (in) Discharge Instruction: Moisten with hydrogel and place on wound bed Secondary Dressing Mepilex Border Flex, 4x4 (in/in) Discharge Instruction: Apply to wound as directed. Do not cut. Secured With Compression Wrap Compression Stockings Add-Ons Electronic Signature(s) Signed: 11/02/2020 9:13:32 AM By: Hansel FeinsteinBishop, Joy Entered By: Hansel FeinsteinBishop, Joy on 11/01/2020 14:35:36 Peragine, Kameran D. ([id number]) -------------------------------------------------------------------------------- Vitals Details Patient Name: Berenice PrimasMOREFIELD, Amadu D. Date of Service: 11/01/2020 2:15 PM Medical Record Number: [id number] Patient Account Number: [id number] Date of Birth/Sex: 09/03/1990 (30 y.o. M) Treating RN: Hansel FeinsteinBishop, Joy Primary Care Kynli Chou: Kandyce RudBabaoff, Marcus Other Clinician: Referring Jacqulyne Gladue: Kandyce RudBabaoff, Marcus Treating Belva Koziel/Extender: Altamese CarolinaOBSON, MICHAEL G Weeks in Treatment: 9 Vital Signs Time Taken: 14:25 Pulse (bpm): 94 Height (in): 54 Respiratory Rate (breaths/min): 16 Weight (lbs): 50 Blood Pressure (mmHg): 112/83 Body Mass Index (BMI): 12.1 Reference Range: 80 - 120 mg / dl Electronic Signature(s) Signed: 11/02/2020 9:13:32 AM By: Hansel FeinsteinBishop, Joy Entered ByHansel Feinstein: Bishop, Joy on 11/01/2020 14:27:51

## 2020-11-02 NOTE — Progress Notes (Signed)
[facility] Consult Note Telephone: [phone]  Fax: [phone]    Date of encounter: 11/02/20 PATIENT NAME: [name] [address] [place] [address]   [phone] (home)  DOB: 11-Jun-1990 MRN: [id number] PRIMARY CARE PROVIDER:    Derinda Late, MD,  [facility] [place] [address] [phone]  REFERRING PROVIDER:   Derinda Late, MD [phone] [address] [facility] [place],  [place] [address] [phone]  RESPONSIBLE PARTY:    Contact Information    Name Relation Home Work Mobile   [place] Father [phone]  [phone]   SHERRIL, HEYWARD  [phone]  [phone]      I met face to face with patient and family in their  home. Palliative Care was asked to follow this patient by consultation request of  Derinda Late, MD to address advance care planning and complex medical decision making. This is a follow up visit.                                   ASSESSMENT AND PLAN / RECOMMENDATIONS:   Advance Care Planning/Goals of Care: Goals include to maximize quality of life and symptom management. 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  Symptom Management/Plan:  Nutrition: Met with family in their home. Mother states patient is in  decline again,  unable to tolerate tube feedings. 15 to 45 minutes after feeds has copious watery diarrhea. She states they are introducing PO to see if he will tolerate that. They've reached out to G.I. services for nutrition consult but were told this would be two months away.   We continue to discuss goals of care. They are interested in aggressive treatment of possible malabsorption. We discussed TPN as a modality. They state this is not being presented so I referred them back to their G.I. team for discussion.  Their existing G.I. physician will be graduating and they will be getting a new primary G.I. provider. I encouraged them to reach out establish and make an appointment for a consultation as soon as possible especially to discuss the nutrition consult issue.   We discussed them trialing him on more PO given association of aspiration already exists with a peg tube. He is able to tolerate 2 to 3 ounces by mouth usually in a sitting. Currently tube feeds are 120 mls five times a day  approximately.   Wound: His wound continues to be monitored by wound management and appears to be getting smaller per father's description that it is harder to pack.  We discussed the impact of poor nutrition on wound healing. I will continue to follow for clarification of goals of care and assistance with complex medical management. Recommend wooden end q tips for gentle packing.  Diarrhea: Having moderate to large volume diarrhea 15-45 min after feeding, this is watery and not loose stool. GI team has suggested prevention of constipation. Has been using banana flakes.   Follow up Palliative Care Visit: Palliative care will continue to follow for complex medical decision making, advance care planning, and clarification of goals. Return 2-4 weeks or prn.  I spent 60 minutes providing this consultation. More than 50% of the time in this consultation was spent in counseling and care coordinati  PPS: 30%  weak  HOSPICE ELIGIBILITY/DIAGNOSIS: TBD  Chief Complaint: malnutrition  HISTORY OF PRESENT ILLNESS:  [name] is a 30 y.o. 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   History obtained from review of EMR, discussion with primary team, and interview with family, facility staff/caregiver and/or Mr. Dripps.  I reviewed available labs, medications, imaging, studies and related documents from the EMR.  Records reviewed and summarized above.   ROS  ENMT: endorses dysphagia Pulmonary: endorses cough, denies increased SOB Abdomen: endorses poor appetite, projectile diarrhea,  endorses incontinence of bowel GU: denies dysuria, endorses incontinence of urine MSK:  Endorses  weakness,  no falls reported Skin: endorses sacral healing stage 4 PI Neurological: endorses discomfort/ pain, endorses  Insomnia with diarrhea Psych: Endorses flat mood Heme/lymph/immuno: denies bruises, abnormal bleeding  Physical Exam: Current and past weights: 49 lbs, has lost weight, had been 53 lbs. 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 Luse.  The palliative care team will continue to follow. Please call our office at [phone] if we can be of additional assistance.   Jason Coop, NP , DNP, MPH, AGPCNP-BC, ACHPN  COVID-19 PATIENT SCREENING TOOL Asked and negative response unless otherwise noted:   Have you had symptoms of covid, tested positive or been in contact with someone with symptoms/positive test in the past 5-10 days?

## 2020-11-14 ENCOUNTER — Other Ambulatory Visit: Payer: No Typology Code available for payment source | Admitting: Primary Care

## 2020-11-14 ENCOUNTER — Encounter: Payer: PRIVATE HEALTH INSURANCE | Admitting: Physician Assistant

## 2020-11-14 ENCOUNTER — Other Ambulatory Visit: Payer: Self-pay

## 2020-11-14 ENCOUNTER — Other Ambulatory Visit
Admission: RE | Admit: 2020-11-14 | Discharge: 2020-11-14 | Disposition: A | Discharge status: Home / Self Care | Payer: PRIVATE HEALTH INSURANCE | Source: Ambulatory Visit | Attending: Physician Assistant | Admitting: Physician Assistant

## 2020-11-14 DIAGNOSIS — B999 Unspecified infectious disease: Secondary | ICD-10-CM | POA: Insufficient documentation

## 2020-11-14 NOTE — Progress Notes (Addendum)
[name] ([id number]) Visit Report for 11/14/2020 Chief Complaint Document Details Patient Name: [name], [name] Date of Service: 11/14/2020 10:45 AM Medical Record Number: [id number] Patient Account Number: [id number] Date of Birth/Sex: 02/19/1991 (30 y.o. M) Treating RN: Dolan Amen Primary Care Provider: Derinda Late Other Clinician: Referring Provider: Derinda Late Treating Provider/Extender: Skipper Cliche in Treatment: 10 Information Obtained from: Patient Chief Complaint 08/30/2020; patient is here for review of a pressure ulcer on his sacrum Electronic Signature(s) Signed: 11/14/2020 11:23:22 AM By: Worthy Keeler PA-C Entered By: Worthy Keeler on 11/14/2020 11:23:22 [facility], Anacleto D. ([id number]) -------------------------------------------------------------------------------- HPI Details Patient Name: [name] Date of Service: 11/14/2020 10:45 AM Medical Record Number: [id number] Patient Account Number: [id number] Date of Birth/Sex: 1991-01-25 (30 y.o. M) Treating RN: Dolan Amen Primary Care Provider: Derinda Late Other Clinician: Referring Provider: Derinda Late Treating Provider/Extender: Skipper Cliche in Treatment: 10 History of Present Illness HPI Description: [name] 08/30/2020 This is a difficult case of a unfortunate 30 year old man with cerebral palsy and seizures. 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 This caused quite a deterioration recently in his function he required placement of a PEG tube for feeding, he developed a significant left upper lobe aspiration driven lung met lung abscess for which she is taken Augmentin. He is nonambulatory. Nevertheless his parents were here today feel that he had ability to communicate and a good  functional level up until very recently. He was hospitalized at the beginning of March for the pneumonia. During this time he was put in one of the hospital standard pressure prevention mattresses although this was apparently meant for an adult resulting in him sliding down and it and he developed a pressure sore on his lower sacrum. 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 They have been using Santyl. Apparently the nurse practitioner for palliative care who comes to see him in the home prescribed this Past medical history includes cerebral palsy with seizures, autoimmune encephalitis. He is a diabetic most recently listed in the [facility] records as type I based on low protein C levels and an anti-GAD antibody however his parents state that this has recently been called into question however I did not delve into this further. He apparently has some form of ulcerative colitis, history of candidemia, nephrolithiasis, he is tube feed dependent although they still give him something orally. Left upper lobe lung abscess followed by ID at [facility] currently on Augmentin 4/6 culture I did last week showed staph lugdunensis. X-ray did not show osteomyelitis was a poor quality film they recommended a CT scan. They have been using Santyl-based dressings. Also note that they changed the antibiotics for the lung abscess to Levaquin and Flagyl from Augmentin. The Levaquin will indeed cover the coag negative staph we cultured. He does have exposed bone however I do not feel pressured to do a CT scan at this point. It would be possible to biopsy the bone should that become necessary 4/13; patient arrives in clinic still using Santyl to his wounds. His parents report up to 9 liquid bowel movements a day over the last 5 to 6 days. They go on to  tell me that he is being worked up  for a small bowel source of diarrhea. He does apparently not have celiac disease. There has been some thought about malabsorption. He had colonoscopy and endoscopy. 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 Since the patient was last here he has been to infectious disease at [facility] predominantly for evaluation of the lung abscess which is resolved however apparently has aspiration pneumonitis now is going back on an additional antibiotic. Stool has been tested for C. difficile toxin and many other pathogens with everything negative. Nevertheless multiple episodes of diarrhea continue. They are also apparently going to change his tube feeding to see if that helps with the diarrhea. 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 He is approved for Grafix but I cannot really determine what co-pay has. It would appear that the maximum is to 37.75 for a 2 x 3 cm. 5/22; 2-week follow-up he was denied for I believe Grafix in spite of what I said 2 weeks ago. The wound is about the same. Small orifice tunneling from about 2-8 o'clock. The bone is actually superior. No evidence of infection. 6/1; nothing new to report on insurance and advanced treatment products. The wound is about the same there is granulation tissue. They report some bleeding after I used silver nitrate on hyper granulated tissue last time she was here. He continues to have copious amounts of diarrhea and has visibly lost weight to both myself and our staff 11/14/2020 upon evaluation today patient appears to be doing a little bit worse in regard to his wound. He is seen with his parents who take excellent care of him in the office today. The wound is actually measuring smaller. With that being said he unfortunately has been in much more pain his mother and father tell me since Saturday. With that increased  pain they have noticed that mainly is with movement and even him some regards potentially regard to the wound. They are concerned about the possibility of infection as it seems more red and erythematous than it was before they have also noticed increased drainage. Fortunately there does not appear to be any signs of systemic infection as far as we can tell anyway. I do believe that the patient could benefit from possibly Hydrofera Blue rope as far as filling in the space where there is tunneling and undermining. I discussed that with the mother and father today this may also help with some of the hypergranulation. Electronic Signature(s) Signed: 11/16/2020 10:02:21 AM By: Worthy Keeler PA-C Entered By: Worthy Keeler on 11/16/2020 10:02:21 Bahena, Duron D. ([id number]) -------------------------------------------------------------------------------- Physical Exam Details Patient Name: [name] Date of Service: 11/14/2020 10:45 AM Medical Record Number: [id number] Patient Account Number: [id number] Date of Birth/Sex: May 26, 1991 (30 y.o. M) Treating RN: Dolan Amen Primary Care Provider: Derinda Late Other Clinician: Referring Provider: Derinda Late Treating Provider/Extender: Skipper Cliche in Treatment: 10 Constitutional Chronically ill appearing but in no apparent acute distress. Respiratory normal breathing without difficulty. Psychiatric Patient is not able to cooperate in decision making regarding care. patient is agitated. Notes Upon inspection patient's wound bed again did show some erythema around the edges of the wound. With that being said I did not see any purulent drainage although again there was some evidence of increased drainage in general which again can be an issue in  and of itself. Especially if it is combined with increased pain this may be the biggest indication that something is going on whether it be an acute or subacute  infection. Nonetheless I am really not wanting to just put him on prophylactic antibiotic therapy I will obtain a deep wound culture just to see if there is anything showing up here. Electronic Signature(s) Signed: 11/16/2020 10:03:14 AM By: Worthy Keeler PA-C Entered By: Worthy Keeler on 11/16/2020 10:03:13 Rutt, Joziyah D. ([id number]) -------------------------------------------------------------------------------- Physician Orders Details Patient Name: [name] Date of Service: 11/14/2020 10:45 AM Medical Record Number: [id number] Patient Account Number: [id number] Date of Birth/Sex: Apr 08, 1991 (30 y.o. M) Treating RN: Dolan Amen Primary Care Provider: Derinda Late Other Clinician: Referring Provider: Derinda Late Treating Provider/Extender: Skipper Cliche in Treatment: 10 Verbal / Phone Orders: No Diagnosis Coding ICD-10 Coding Code Description L89.154 Pressure ulcer of sacral region, stage 4 E43 Unspecified severe protein-calorie malnutrition G80.9 Cerebral palsy, unspecified Follow-up Appointments o Return Appointment in 1 week. Bathing/ Shower/ Hygiene o Clean wound with Normal Saline or wound cleanser. Off-Loading o Turn and reposition every 2 hours o Other: - Keep all pressure of of wounded area Wound Treatment Wound #1 - Sacrum Cleanser: Normal Saline 3 x Per Week/15 Days Discharge Instructions: Wash your hands with soap and water. Remove old dressing, discard into plastic bag and place into trash. Cleanse the wound with Normal Saline prior to applying a clean dressing using gauze sponges, not tissues or cotton balls. Do not scrub or use excessive force. Pat dry using gauze sponges, not tissue or cotton balls. Primary Dressing: Hydrofera Blue Classic Foam Rope Dressing, 9x6 (mm/in) 3 x Per Week/15 Days Discharge Instructions: Cut rope in half-1 cm in length, coat with lidocaine cream/benzocaine, pack in wound (extras sent  with patient) Secondary Dressing: Mepilex Border Flex, 4x4 (in/in) 3 x Per Week/15 Days Discharge Instructions: Apply to wound as directed. Do not cut. Laboratory o Bacteria identified in Wound by Culture (MICRO) - Sacrum oooo LOINC Code: 1443-1 VQMG Convenience Name: Wound culture routine Electronic Signature(s) Signed: 11/14/2020 4:32:42 PM By: Georges Mouse, Minus Breeding RN Signed: 11/16/2020 4:01:31 PM By: Worthy Keeler PA-C Entered By: Georges Mouse, Minus Breeding on 11/14/2020 11:59:08 Basley, Nickie D. ([id number]) -------------------------------------------------------------------------------- Problem List Details Patient Name: [name] Date of Service: 11/14/2020 10:45 AM Medical Record Number: [id number] Patient Account Number: [id number] Date of Birth/Sex: December 09, 1990 (30 y.o. M) Treating RN: Dolan Amen Primary Care Provider: Derinda Late Other Clinician: Referring Provider: Derinda Late Treating Provider/Extender: Jeri Cos Weeks in Treatment: 10 Active Problems ICD-10 Encounter Code Description Active Date MDM Diagnosis L89.154 Pressure ulcer of sacral region, stage 4 08/30/2020 No Yes E43 Unspecified severe protein-calorie malnutrition 08/30/2020 No Yes G80.9 Cerebral palsy, unspecified 08/30/2020 No Yes Inactive Problems Resolved Problems Electronic Signature(s) Signed: 11/14/2020 11:23:05 AM By: Worthy Keeler PA-C Entered By: Worthy Keeler on 11/14/2020 11:23:05 Blocher, Jacorie D. ([id number]) -------------------------------------------------------------------------------- Progress Note Details Patient Name: [name] Date of Service: 11/14/2020 10:45 AM Medical Record Number: [id number] Patient Account Number: [id number] Date of Birth/Sex: [birth year] (30 y.o. M) Treating RN: Dolan Amen Primary Care Provider: Derinda Late Other Clinician: Referring Provider: Derinda Late Treating Provider/Extender: Skipper Cliche  in Treatment: 10 Subjective Chief Complaint Information obtained from Patient 08/30/2020; patient is here for review of a pressure ulcer on his sacrum History of Present Illness (HPI) TeensADMISSION 08/30/2020 This is a difficult case of a unfortunate 30 year old man with cerebral palsy and seizures. 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 This caused quite a deterioration recently in his function he required placement of a PEG tube for feeding, he developed a significant left upper lobe aspiration driven lung met lung abscess for which she is taken Augmentin. He is nonambulatory. Nevertheless his parents were here today feel that he had ability to communicate and a good functional level up until very recently. He was hospitalized at the beginning of March for the pneumonia. During this time he was put in one of the hospital standard pressure prevention mattresses although this was apparently meant for an adult resulting in him sliding down and it and he developed a pressure sore on his lower sacrum. 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 They have been using Santyl. Apparently the nurse practitioner for palliative care who comes to see him in the home prescribed this Past medical history includes cerebral palsy with seizures, autoimmune encephalitis. He is a diabetic most recently listed in the [place] records as type I based on low protein C levels and an anti-GAD antibody however his parents state that this has recently been called into question however I did not delve into this further. He apparently has some form of ulcerative colitis, history of candidemia, nephrolithiasis, he is tube  feed dependent although they still give him something orally. Left upper lobe lung abscess followed by ID at [facility] currently on Augmentin 4/6 culture I did last week showed staph lugdunensis. X-ray did not show osteomyelitis was a poor quality film they recommended a CT scan. They have been using Santyl-based dressings. Also note that they changed the antibiotics for the lung abscess to Levaquin and Flagyl from Augmentin. The Levaquin will indeed cover the coag negative staph we cultured. He does have exposed bone however I do not feel pressured to do a CT scan at this point. It would be possible to biopsy the bone should that become necessary 4/13; patient arrives in clinic still using Santyl to his wounds. His parents report up to 9 liquid bowel movements a day over the last 5 to 6 days. They go on to tell me that he is being worked up for a small bowel source of diarrhea. He does apparently not have celiac disease. There has been some thought about malabsorption. He had colonoscopy and endoscopy. 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 Since the patient was last here he has been to infectious disease at [facility] predominantly for evaluation of the lung abscess which is resolved however apparently has aspiration pneumonitis now is going back on an additional antibiotic. Stool has been tested for C. difficile toxin and many other pathogens with everything negative. Nevertheless multiple episodes of diarrhea continue. They are also apparently going to change his tube feeding to see if that helps with the diarrhea. 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 He is approved for Grafix but I cannot really determine what co-pay has. It would appear that the maximum is to 37.75 for a 2 x 3 cm. 5/22; 2-week follow-up  he was denied for I believe Grafix in spite of what I  said 2 weeks ago. The wound is about the same. Small orifice tunneling from about 2-8 o'clock. The bone is actually superior. No evidence of infection. 6/1; nothing new to report on insurance and advanced treatment products. The wound is about the same there is granulation tissue. They report some bleeding after I used silver nitrate on hyper granulated tissue last time she was here. He continues to have copious amounts of diarrhea and has visibly lost weight to both myself and our staff 11/14/2020 upon evaluation today patient appears to be doing a little bit worse in regard to his wound. He is seen with his parents who take excellent care of him in the office today. The wound is actually measuring smaller. With that being said he unfortunately has been in much more pain his mother and father tell me since Saturday. With that increased pain they have noticed that mainly is with movement and even him some regards potentially regard to the wound. They are concerned about the possibility of infection as it seems more red and erythematous than it was before they have also noticed increased drainage. Fortunately there does not appear to be any signs of systemic infection as far as we can tell anyway. I do believe that the patient could benefit from possibly Hydrofera Blue rope as far as filling in the space where there is tunneling and undermining. I discussed that with the mother and father today this may also help with some of the hypergranulation. Objective Platts, Jomes D. ([id number]) Constitutional Chronically ill appearing but in no apparent acute distress. Vitals Time Taken: 11:08 AM, Height: 54 in, Weight: 50 lbs, BMI: 12.1, Temperature: 96.9 F, Pulse: 98 bpm, Respiratory Rate: 20 breaths/min, Blood Pressure: 103/72 mmHg. Respiratory normal breathing without difficulty. Psychiatric Patient is not able to cooperate in decision making regarding care. patient is agitated. General Notes:  Upon inspection patient's wound bed again did show some erythema around the edges of the wound. With that being said I did not see any purulent drainage although again there was some evidence of increased drainage in general which again can be an issue in and of itself. Especially if it is combined with increased pain this may be the biggest indication that something is going on whether it be an acute or subacute infection. Nonetheless I am really not wanting to just put him on prophylactic antibiotic therapy I will obtain a deep wound culture just to see if there is anything showing up here. Integumentary (Hair, Skin) Wound #1 status is Open. Original cause of wound was Gradually Appeared. The date acquired was: 08/04/2020. The wound has been in treatment 10 weeks. The wound is located on the Sacrum. The wound measures 0.5cm length x 0.4cm width x 0.9cm depth; 0.157cm^2 area and 0.141cm^3 volume. There is Fat Layer (Subcutaneous Tissue) exposed. There is no tunneling noted, however, there is undermining starting at 12:00 and ending at 12:00 with a maximum distance of 2.2cm. There is a medium amount of serous drainage noted. There is medium (34-66%) pink, hyper - granulation within the wound bed. There is a medium (34-66%) amount of necrotic tissue within the wound bed including Adherent Slough. Assessment Active Problems ICD-10 Pressure ulcer of sacral region, stage 4 Unspecified severe protein-calorie malnutrition Cerebral palsy, unspecified Plan Follow-up Appointments: Return Appointment in 1 week. Bathing/ Shower/ Hygiene: Clean wound with Normal Saline or wound cleanser. Off-Loading: Turn and reposition every 2 hours Other: -  Keep all pressure of of wounded area Laboratory ordered were: Wound culture routine - Sacrum WOUND #1: - Sacrum Wound Laterality: Cleanser: Normal Saline 3 x Per Week/15 Days Discharge Instructions: Wash your hands with soap and water. Remove old dressing,  discard into plastic bag and place into trash. Cleanse the wound with Normal Saline prior to applying a clean dressing using gauze sponges, not tissues or cotton balls. Do not scrub or use excessive force. Pat dry using gauze sponges, not tissue or cotton balls. Primary Dressing: Hydrofera Blue Classic Foam Rope Dressing, 9x6 (mm/in) 3 x Per Week/15 Days Discharge Instructions: Cut rope in half-1 cm in length, coat with lidocaine cream/benzocaine, pack in wound (extras sent with patient) Secondary Dressing: Mepilex Border Flex, 4x4 (in/in) 3 x Per Week/15 Days Discharge Instructions: Apply to wound as directed. Do not cut. 1. I would recommend currently that we actually go ahead and send the culture for the patient to the pharmacy. 2. I am also can recommend that we go ahead and utilize Hydrofera Blue rope to see if that would be of benefit for the patient as far as trying to help with some of the hypergranulation and filling the space to allow wicking out of the fluid that may just be collecting within the wound bed. 3. I did recommend that the patient's parents can put some numbing cream on the road before inserting it to try to help out with some of the discomfort for both the dressing change as well as the time immediately following. Nonetheless I believe that we do not want to change the dressings more frequently just to put numbing medication in place. Calzada, Gregroy D. ([id number]) 4. I did send a wound culture but again I am not initiating any prophylactic antibiotic therapy with the significant diarrhea that he is experiencing anyway I am very leery of placing him on an antibiotic that does not have to be. In fact we will see what the culture shows but I would probably leave this to Dr. Dellia Nims depending on how things look on Wednesday next week when he sees the patient as to whether or not we initiate anything from an antibiotic standpoint. We will see patient back for reevaluation in  1 week here in the clinic. If anything worsens or changes patient will contact our office for additional recommendations. Electronic Signature(s) Signed: 11/16/2020 10:05:00 AM By: Worthy Keeler PA-C Entered By: Worthy Keeler on 11/16/2020 10:05:00 Bellizzi, Emauri D. ([id number]) -------------------------------------------------------------------------------- SuperBill Details Patient Name: [name] Date of Service: 11/14/2020 Medical Record Number: [id number] Patient Account Number: [id number] Date of Birth/Sex: September 08, 1990 (30 y.o. M) Treating RN: Dolan Amen Primary Care Provider: Derinda Late Other Clinician: Referring Provider: Derinda Late Treating Provider/Extender: Skipper Cliche in Treatment: 10 Diagnosis Coding ICD-10 Codes Code Description [phone] Pressure ulcer of sacral region, stage 4 E43 Unspecified severe protein-calorie malnutrition G80.9 Cerebral palsy, unspecified Facility Procedures CPT4 Code: [id number] Description: 99213 - WOUND CARE VISIT-LEV 3 EST PT Modifier: Quantity: 1 Physician Procedures CPT4 Code: 7001749 Description: 44967 - WC PHYS LEVEL 4 - EST PT Modifier: Quantity: 1 CPT4 Code: Description: ICD-10 Diagnosis Description L89.154 Pressure ulcer of sacral region, stage 4 E43 Unspecified severe protein-calorie malnutrition G80.9 Cerebral palsy, unspecified Modifier: Quantity: Electronic Signature(s) Signed: 11/16/2020 10:05:19 AM By: Worthy Keeler PA-C Previous Signature: 11/14/2020 4:32:42 PM Version By: Georges Mouse, Minus Breeding RN Entered By: Worthy Keeler on 11/16/2020 10:05:19

## 2020-11-15 NOTE — Progress Notes (Addendum)
ALPHONSE, ASBRIDGE ([id number]) Visit Report for 11/14/2020 Arrival Information Details Patient Name: [name], [name] Date of Service: 11/14/2020 10:45 AM Medical Record Number: [id number] Patient Account Number: [id number] Date of Birth/Sex: [birth year] (30 y.o. M) Treating RN: Yevonne Pax Primary Care Veona Bittman: Kandyce Rud Other Clinician: Referring Hawkins Seaman: Kandyce Rud Treating Debarah Mccumbers/Extender: Rowan Blase in Treatment: 10 Visit Information History Since Last Visit All ordered tests and consults were completed: No Patient Arrived: Wheel Chair Added or deleted any medications: No Arrival Time: 10:52 Any new allergies or adverse reactions: No Accompanied By: parents Had a fall or experienced change in No Transfer Assistance: None activities of daily living that may affect Patient Identification Verified: Yes risk of falls: Secondary Verification Process Completed: Yes Signs or symptoms of abuse/neglect since last visito No Patient Requires Transmission-Based Precautions: No Hospitalized since last visit: No Patient Has Alerts: Yes Implantable device outside of the clinic excluding No Patient Alerts: DIABETIC cellular tissue based products placed in the center since last visit: Has Dressing in Place as Prescribed: Yes Pain Present Now: Yes Electronic Signature(s) Signed: 11/15/2020 3:34:38 PM By: Yevonne Pax RN Entered By: Yevonne Pax on 11/14/2020 11:08:18 [name], [name] ([id number]) -------------------------------------------------------------------------------- Clinic Level of Care Assessment Details Patient Name: [name] Date of Service: 11/14/2020 10:45 AM Medical Record Number: [id number] Patient Account Number: [id number] Date of Birth/Sex: 05-18-1991 (30 y.o. M) Treating RN: Rogers Blocker Primary Care Gemini Bunte: Kandyce Rud Other Clinician: Referring Deisha Stull: Kandyce Rud Treating Mehak Roskelley/Extender: Rowan Blase in Treatment: 10 Clinic Level of Care Assessment Items TOOL 4 Quantity Score X - Use when only an EandM is performed on FOLLOW-UP visit 1 0 ASSESSMENTS - Nursing Assessment / Reassessment X - Reassessment of Co-morbidities (includes updates in patient status) 1 10 X- 1 5 Reassessment of Adherence to Treatment Plan ASSESSMENTS - Wound and Skin Assessment / Reassessment X - Simple Wound Assessment / Reassessment - one wound 1 5 []  - 0 Complex Wound Assessment / Reassessment - multiple wounds []  - 0 Dermatologic / Skin Assessment (not related to wound area) ASSESSMENTS - Focused Assessment []  - Circumferential Edema Measurements - multi extremities 0 []  - 0 Nutritional Assessment / Counseling / Intervention []  - 0 Lower Extremity Assessment (monofilament, tuning fork, pulses) []  - 0 Peripheral Arterial Disease Assessment (using hand held doppler) ASSESSMENTS - Ostomy and/or Continence Assessment and Care []  - Incontinence Assessment and Management 0 []  - 0 Ostomy Care Assessment and Management (repouching, etc.) PROCESS - Coordination of Care X - Simple Patient / Family Education for ongoing care 1 15 []  - 0 Complex (extensive) Patient / Family Education for ongoing care []  - 0 Staff obtains , Records, Test Results / Process Orders []  - 0 Staff telephones HHA, Nursing Homes / Clarify orders / etc []  - 0 Routine Transfer to another Facility (non-emergent condition) []  - 0 Routine Hospital Admission (non-emergent condition) []  - 0 New Admissions / / Ordering NPWT, Apligraf, etc. []  - 0 Emergency Hospital Admission (emergent condition) X- 1 10 Simple Discharge Coordination []  - 0 Complex (extensive) Discharge Coordination PROCESS - Special Needs []  - Pediatric / Minor Patient Management 0 []  - 0 Isolation Patient Management []  - 0 Hearing / Language / Visual special needs []  - 0 Assessment of Community assistance  (transportation, D/C planning, etc.) []  - 0 Additional assistance / Altered mentation []  - 0 Support Surface(s) Assessment (bed, cushion, seat, etc.) INTERVENTIONS - Wound Cleansing / Measurement [name], [name] ( ) X- 1 5  Simple Wound Cleansing - one wound []  - 0 Complex Wound Cleansing - multiple wounds X- 1 5 Wound Imaging (photographs - any number of wounds) []  - 0 Wound Tracing (instead of photographs) X- 1 5 Simple Wound Measurement - one wound []  - 0 Complex Wound Measurement - multiple wounds INTERVENTIONS - Wound Dressings []  - Small Wound Dressing one or multiple wounds 0 X- 1 15 Medium Wound Dressing one or multiple wounds []  - 0 Large Wound Dressing one or multiple wounds []  - 0 Application of Medications - topical []  - 0 Application of Medications - injection INTERVENTIONS - Miscellaneous []  - External ear exam 0 X- 1 5 Specimen Collection (cultures, biopsies, blood, body fluids, etc.) X- 1 5 Specimen(s) / Culture(s) sent or taken to Lab for analysis []  - 0 Patient Transfer (multiple staff / / Similar devices) []  - 0 Simple Staple / Suture removal (25 or less) []  - 0 Complex Staple / Suture removal (26 or more) []  - 0 Hypo / Hyperglycemic Management (close monitor of Blood Glucose) []  - 0 Ankle / Brachial Index (ABI) - do not check if billed separately X- 1 5 Vital Signs Has the patient been seen at the hospital within the last three years: Yes Total Score: 90 Level Of Care: New/Established - Level 3 Electronic Signature(s) Signed: 11/14/2020 4:32:42 PM By: , RN Entered By: , Kenia on 11/14/2020 11:56:38 [name], [name] ( ) -------------------------------------------------------------------------------- Complex / Palliative Patient Assessment Details Patient Name: Date of Service: 11/14/2020 10:45 AM Medical Record Number: Nurse, adult Patient Account  Number: Date of Birth/Sex: [birth year] (30 y.o. 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 Complex Wound Management Criteria Care Approach Wound Care Plan: Palliative Wound Management Electronic Signature(s) Signed: 12/27/2020 9:25:44 AM By: Phillis Haggis, BSN, RN, CWS, Kim RN, BSN Signed: 01/31/2021 5:27:20 PM By: [id number] PA-C Entered By: [name] BSN, RN, CWS, Kim on 12/27/2020 09:25:44 [name], [name] ([id number]) -------------------------------------------------------------------------------- Encounter Discharge Information Details Patient Name: [id number] Date of Service: 11/14/2020 10:45 AM Medical Record Number: Huel Coventry Patient Account Number: Kandyce Rud Date of Birth/Sex: 02/28/1991 (30 y.o. M) Treating RN: Rowan Blase Primary Care Dexton Zwilling: 12/29/2020 Other Clinician: Referring Kealii Thueson: Elliot Gurney Treating Shemeca Lukasik/Extender: 02/02/2021 in Treatment: 10 Encounter Discharge Information Items Discharge Condition: Stable Ambulatory Status: Wheelchair Discharge Destination: Home Transportation: Private Auto Accompanied By: parents Schedule Follow-up Appointment: Yes Clinical Summary of Care: Electronic Signature(s) Signed: 11/14/2020 4:29:31 PM By: Elliot Gurney Entered By: 12/29/2020 on 11/14/2020 12:12:54 [name], [name] ([name]) -------------------------------------------------------------------------------- Lower Extremity Assessment Details Patient Name: 11/16/2020 Date of Service: 11/14/2020 10:45 AM Medical Record Number: [id number] Patient Account Number: 10/28/1990 Date of Birth/Sex:  10/28/1990 (30 y.o. M) Treating RN: Kandyce Rud Primary Care Candice Lunney: Kandyce Rud Other Clinician: Referring Deandre Brannan: Rowan Blase Treating Ely Spragg/Extender: 11/16/2020 in Treatment: 10 Electronic Signature(s) Signed: 11/15/2020 3:34:38 PM By: Hansel Feinstein RN Entered By: 11/16/2020 on 11/14/2020 11:13:58 Swails, Steadman D. ([name]) -------------------------------------------------------------------------------- Multi Wound Chart Details Patient Name: 11/16/2020 Date of Service: 11/14/2020 10:45 AM Medical Record Number: [id number] Patient Account Number: 10/28/1990 Date of Birth/Sex: November 25, 1990 (30 y.o. M) Treating RN: Kandyce Rud Primary Care Zulay Corrie: Kandyce Rud Other  Clinician: Referring Sherard Sutch: Kandyce RudBabaoff, Marcus Treating Kambree Krauss/Extender: Rowan BlaseStone, Hoyt Weeks in Treatment: 10 Vital Signs Height(in): 54 Pulse(bpm): 98 Weight(lbs): 50 Blood Pressure(mmHg): 103/72 Body Mass Index(BMI): 12 Temperature(F): 96.9 Respiratory Rate(breaths/min): 20 Photos: [N/A:N/A] Wound Location: Sacrum N/A N/A Wounding Event: Gradually Appeared N/A N/A Primary Etiology: Pressure Ulcer N/A N/A Comorbid History: Type II Diabetes N/A N/A Date Acquired: 08/04/2020 N/A N/A Weeks of Treatment: 10 N/A N/A Wound Status: Open N/A N/A Measurements L x W x D (cm) 0.5x0.4x0.9 N/A N/A Area (cm) : 0.157 N/A N/A Volume (cm) : 0.141 N/A N/A % Reduction in Area: 92.90% N/A N/A % Reduction in Volume: 94.70% N/A N/A Starting Position 1 (o'clock): 12 Ending Position 1 (o'clock): 12 Maximum Distance 1 (cm): 2.2 Undermining: Yes N/A N/A Classification: Category/Stage IV N/A N/A Exudate Amount: Medium N/A N/A Exudate Type: Serous N/A N/A Exudate Color: amber N/A N/A Granulation Amount: Medium (34-66%) N/A N/A Granulation Quality: Pink, Hyper-granulation N/A N/A Necrotic Amount: Medium (34-66%) N/A N/A Exposed Structures: Fat Layer (Subcutaneous Tissue): N/A  N/A Yes Fascia: No Tendon: No Muscle: No Joint: No Bone: No Epithelialization: None N/A N/A Treatment Notes Electronic Signature(s) Signed: 11/14/2020 4:32:42 PM By: Phillis HaggisSanchez Pereyda, Dondra PraderKenia RN Entered By: Phillis HaggisSanchez Pereyda, Dondra PraderKenia on 11/14/2020 11:47:18 [name], [name] ([id number]) -------------------------------------------------------------------------------- Multi-Disciplinary Care Plan Details Patient Name: Joshua HoraMOREFIELD, Hayden D. Date of Service: 11/14/2020 10:45 AM Medical Record Number: [id number] Patient Account Number: [id number] Date of Birth/Sex: [birth year] (30 y.o. M) Treating RN: Rogers BlockerSanchez, Kenia Primary Care Rishita Petron: Kandyce RudBabaoff, Marcus Other Clinician: Referring Lindzey Zent: Kandyce RudBabaoff, Marcus Treating Jamaia Brum/Extender: Rowan BlaseStone, Hoyt Weeks in Treatment: 10 Active Inactive Electronic Signature(s) Signed: 12/29/2020 8:01:30 AM By: Elliot GurneyWoody, BSN, RN, CWS, Kim RN, BSN Signed: 02/01/2021 4:51:00 PM By: Rogers BlockerSanchez , Kenia RN Previous Signature: 11/14/2020 4:32:42 PM Version By: Phillis HaggisSanchez Pereyda, Dondra PraderKenia RN Entered By: Elliot GurneyWoody, BSN, RN, CWS, Kim on 12/29/2020 08:01:29 [name], [name] ([id number]) -------------------------------------------------------------------------------- Pain Assessment Details Patient Name: Joshua HoraMOREFIELD, [name] Date of Service: 11/14/2020 10:45 AM Medical Record Number: [id number] Patient Account Number: [id number] Date of Birth/Sex: [birth year] (30 y.o. M) Treating RN: Yevonne PaxEpps, Carrie Primary Care Cathleen Yagi: Kandyce RudBabaoff, Marcus Other Clinician: Referring Ouita Nish: Kandyce RudBabaoff, Marcus Treating Lorice Lafave/Extender: Rowan BlaseStone, Hoyt Weeks in Treatment: 10 Active Problems Location of Pain Severity and Description of Pain Patient Has Paino Yes Site Locations With Dressing Change: Yes Duration of the Pain. Constant / Intermittento Constant Rate the pain. Current Pain Level: Insensate Worst Pain Level: Insensate Least Pain Level: Insensate Tolerable Pain Level: Insensate Pain Management  and Medication Current Pain Management: Electronic Signature(s) Signed: 11/15/2020 3:34:38 PM By: Yevonne PaxEpps, Carrie RN Entered By: Yevonne PaxEpps, Carrie on 11/14/2020 11:09:30 [name], [name] ([id number]) -------------------------------------------------------------------------------- Patient/Caregiver Education Details Patient Name: Joshua HoraMOREFIELD, Cleon D. Date of Service: 11/14/2020 10:45 AM Medical Record Number: [id number] Patient Account Number: [id number] Date of Birth/Gender: [birth year] (30 y.o. M) Treating RN: Rogers BlockerSanchez, Kenia Primary Care Physician: Kandyce RudBabaoff, Marcus Other Clinician: Referring Physician: Kandyce RudBabaoff, Marcus Treating Physician/Extender: Rowan BlaseStone, Hoyt Weeks in Treatment: 10 Education Assessment Education Provided To: Patient Education Topics Provided Wound/Skin Impairment: Methods: Explain/Verbal Responses: State content correctly Electronic Signature(s) Signed: 11/14/2020 4:32:42 PM By: Phillis HaggisSanchez Pereyda, Dondra PraderKenia RN Entered By: Phillis HaggisSanchez Pereyda, Dondra PraderKenia on 11/14/2020 11:56:50 [name], [name] ([id number]) -------------------------------------------------------------------------------- Wound Assessment Details Patient Name: Joshua HoraMOREFIELD, Lynard D. Date of Service: 11/14/2020 10:45 AM Medical Record Number: [id number] Patient Account Number: [id number] Date of Birth/Sex: [birth year] (30 y.o. M) Treating RN: Yevonne PaxEpps, Carrie Primary Care Janeane Cozart: Kandyce RudBabaoff, Marcus Other Clinician: Referring Riyan Haile: Kandyce RudBabaoff, Marcus Treating Jawann Urbani/Extender: Rowan BlaseStone, Hoyt Weeks in Treatment: 10 Wound Status Wound Number:  1 Primary Etiology: Pressure Ulcer Wound Location: Sacrum Wound Status: Open Wounding Event: Gradually Appeared Comorbid History: Type II Diabetes Date Acquired: 08/04/2020 Weeks Of Treatment: 10 Clustered Wound: No Photos Wound Measurements Length: (cm) 0.5 Width: (cm) 0.4 Depth: (cm) 0.9 Area: (cm) 0.157 Volume: (cm) 0.141 % Reduction in Area: 92.9% % Reduction in Volume:  94.7% Epithelialization: None Tunneling: No Undermining: Yes Starting Position (o'clock): 12 Ending Position (o'clock): 12 Maximum Distance: (cm) 2.2 Wound Description Classification: Category/Stage IV Exudate Amount: Medium Exudate Type: Serous Exudate Color: amber Foul Odor After Cleansing: No Slough/Fibrino Yes Wound Bed Granulation Amount: Medium (34-66%) Exposed Structure Granulation Quality: Pink, Hyper-granulation Fascia Exposed: No Necrotic Amount: Medium (34-66%) Fat Layer (Subcutaneous Tissue) Exposed: Yes Necrotic Quality: Adherent Slough Tendon Exposed: No Muscle Exposed: No Joint Exposed: No Bone Exposed: No Electronic Signature(s) Signed: 11/15/2020 3:34:38 PM By: Yevonne Pax RN Entered By: Yevonne Pax on 11/14/2020 11:15:50 Tilghman, Faizon D. ([id number]) -------------------------------------------------------------------------------- Vitals Details Patient Name: [name] Date of Service: 11/14/2020 10:45 AM Medical Record Number: [id number] Patient Account Number: [id number] Date of Birth/Sex: Dec 06, 1990 (30 y.o. M) Treating RN: Yevonne Pax Primary Care Kaesha Kirsch: Kandyce Rud Other Clinician: Referring Nicholle Falzon: Kandyce Rud Treating Kyzen Horn/Extender: Rowan Blase in Treatment: 10 Vital Signs Time Taken: 11:08 Temperature (F): 96.9 Height (in): 54 Pulse (bpm): 98 Weight (lbs): 50 Respiratory Rate (breaths/min): 20 Body Mass Index (BMI): 12.1 Blood Pressure (mmHg): 103/72 Reference Range: 80 - 120 mg / dl Electronic Signature(s) Signed: 11/15/2020 3:34:38 PM By: Yevonne Pax RN Entered By: Yevonne Pax on 11/14/2020 11:08:48

## 2020-11-17 ENCOUNTER — Telehealth: Payer: Self-pay | Admitting: Primary Care

## 2020-11-17 LAB — AEROBIC CULTURE W GRAM STAIN (SUPERFICIAL SPECIMEN): Special Requests: NORMAL

## 2020-11-17 NOTE — Telephone Encounter (Signed)
Received call from inpatient team at [facility] re planning for transfer home after hip fx. Pt not able to have surgery and discussed options for home support.

## 2020-11-22 ENCOUNTER — Ambulatory Visit: Payer: PRIVATE HEALTH INSURANCE | Admitting: Internal Medicine

## 2020-12-14 ENCOUNTER — Telehealth (INDEPENDENT_AMBULATORY_CARE_PROVIDER_SITE_OTHER): Payer: Self-pay | Admitting: Pediatrics

## 2020-12-14 NOTE — Telephone Encounter (Signed)
Patient is not a patient of Dr. Artis Flock at the [facility]. Form returned with this indicated.

## 2020-12-14 NOTE — Telephone Encounter (Signed)
  Who's calling (name and relationship to patient) : [name] Best contact number: [phone] Provider they see:  Margurite Auerbach, MD Reason for call: Please check for prior authorization was sent over to [facility] on 7/12 and will be faxed [facility] office today please be on the look out     PRESCRIPTION REFILL ONLY  Name of prescription:  Pharmacy:

## 2021-01-04 ENCOUNTER — Other Ambulatory Visit (HOSPITAL_COMMUNITY): Payer: Self-pay

## 2021-01-04 MED ORDER — INSULIN GLARGINE 100 UNIT/ML SOLOSTAR PEN: PEN_INJECTOR | SUBCUTANEOUS | 12 refills | Status: DC

## 2021-01-04 MED ORDER — INSULIN LISPRO (1 UNIT DIAL) 100 UNIT/ML (KWIKPEN)
PEN_INJECTOR | SUBCUTANEOUS | 99 refills | Status: DC
  Filled 2021-01-04: qty 15, 75d supply, fill #0
  Filled 2021-04-06: qty 6, 30d supply, fill #0
  Filled 2021-05-24: qty 6, 30d supply, fill #1
  Filled 2021-07-18: qty 6, 30d supply, fill #2
  Filled 2021-09-13: qty 6, 30d supply, fill #3
  Filled 2021-11-09: qty 6, 30d supply, fill #4

## 2021-03-27 ENCOUNTER — Telehealth (INDEPENDENT_AMBULATORY_CARE_PROVIDER_SITE_OTHER): Payer: Self-pay | Admitting: Pediatrics

## 2021-03-27 NOTE — Telephone Encounter (Signed)
  Who's calling (name and relationship to patient) :Dois Davenport with [facility]   Best contact [phone]   Provider they see:Dr. Artis Flock   Reason for call:Pharmacy called needing the quantity for a medication.      PRESCRIPTION REFILL ONLY  Name of prescription:Nystatin  Pharmacy:[facility]

## 2021-03-29 NOTE — Telephone Encounter (Signed)
Hospice patient, will be dealt with under [facility].   Lorenz Coaster MD MPH

## 2021-04-06 ENCOUNTER — Other Ambulatory Visit (HOSPITAL_COMMUNITY): Payer: Self-pay

## 2021-04-07 ENCOUNTER — Other Ambulatory Visit (HOSPITAL_COMMUNITY): Payer: Self-pay

## 2021-04-09 ENCOUNTER — Other Ambulatory Visit (HOSPITAL_COMMUNITY): Payer: Self-pay

## 2021-05-24 ENCOUNTER — Other Ambulatory Visit (HOSPITAL_COMMUNITY): Payer: Self-pay

## 2021-07-18 ENCOUNTER — Other Ambulatory Visit (HOSPITAL_COMMUNITY): Payer: Self-pay

## 2021-07-24 IMAGING — CR DG SACRUM/COCCYX 2+V
3 series · 3 of 3 positions shown · non-contrast
Comparison: None.

CLINICAL DATA: Nonhealing wound on sacrum.

EXAM:
SACRUM AND COCCYX - 2+ VIEW

[coccyx ap]
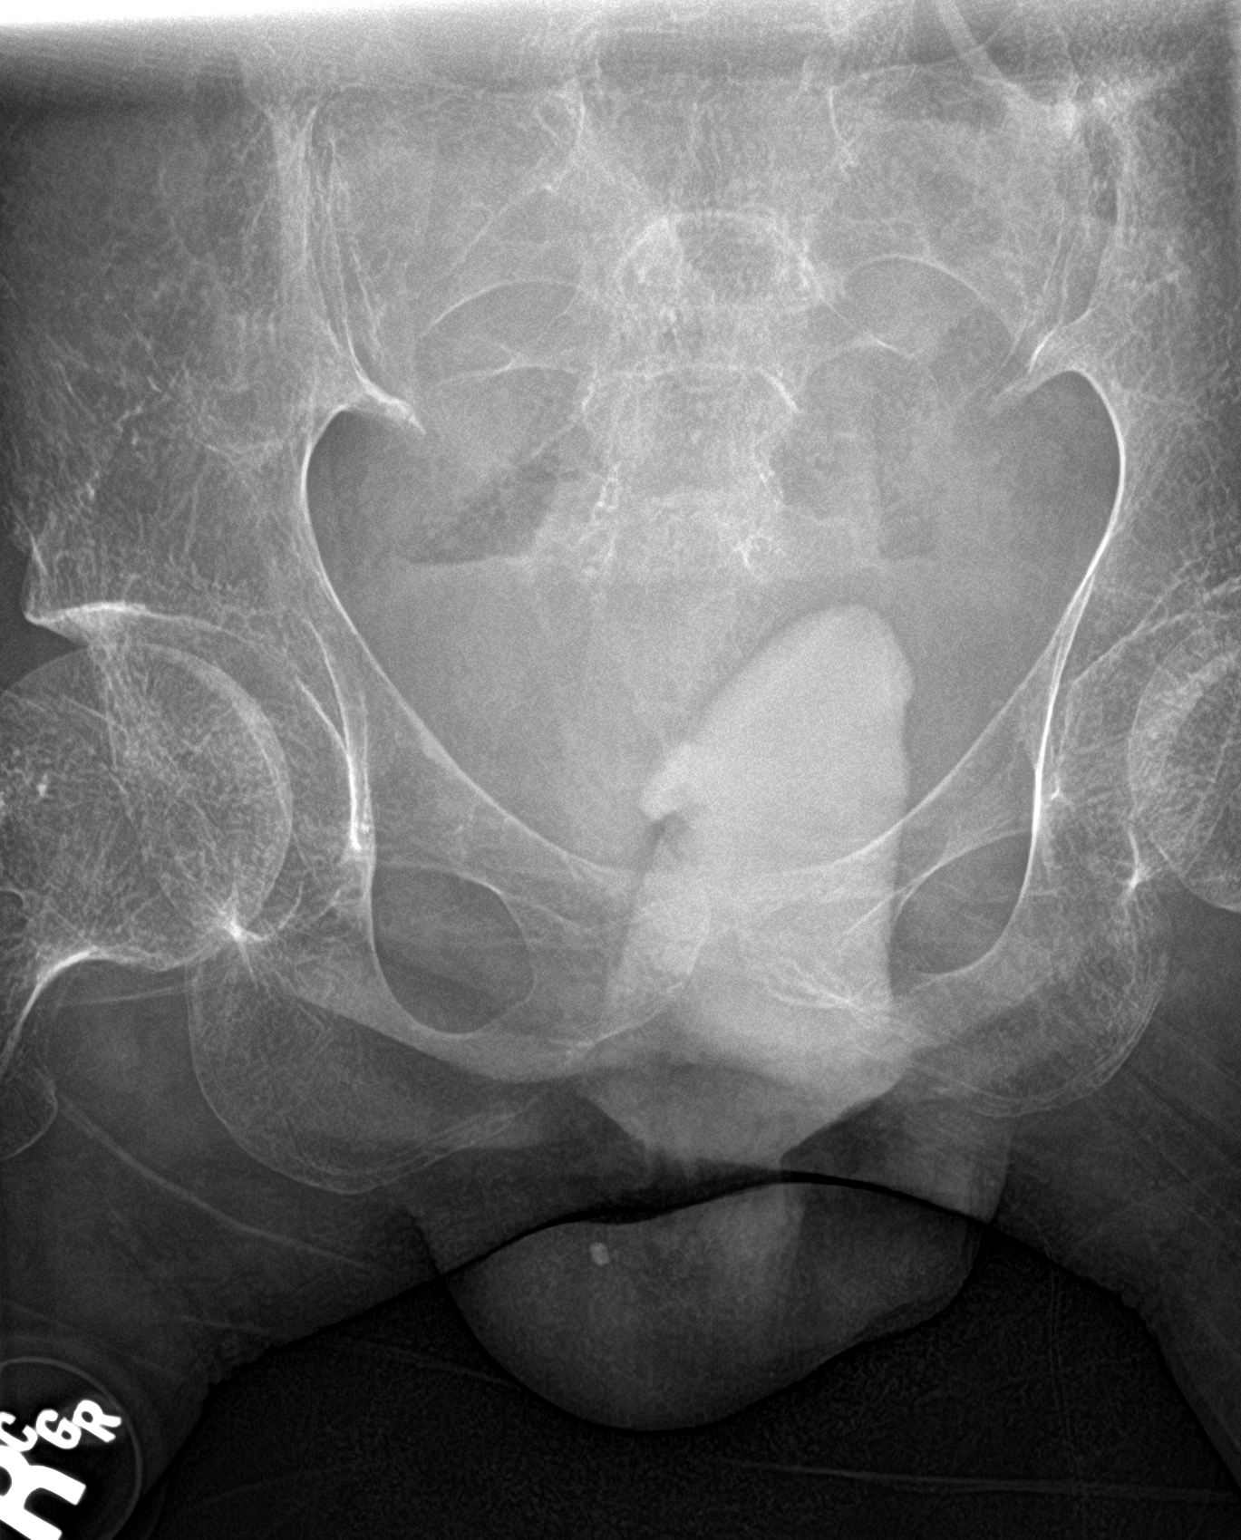

[sacrum ap]
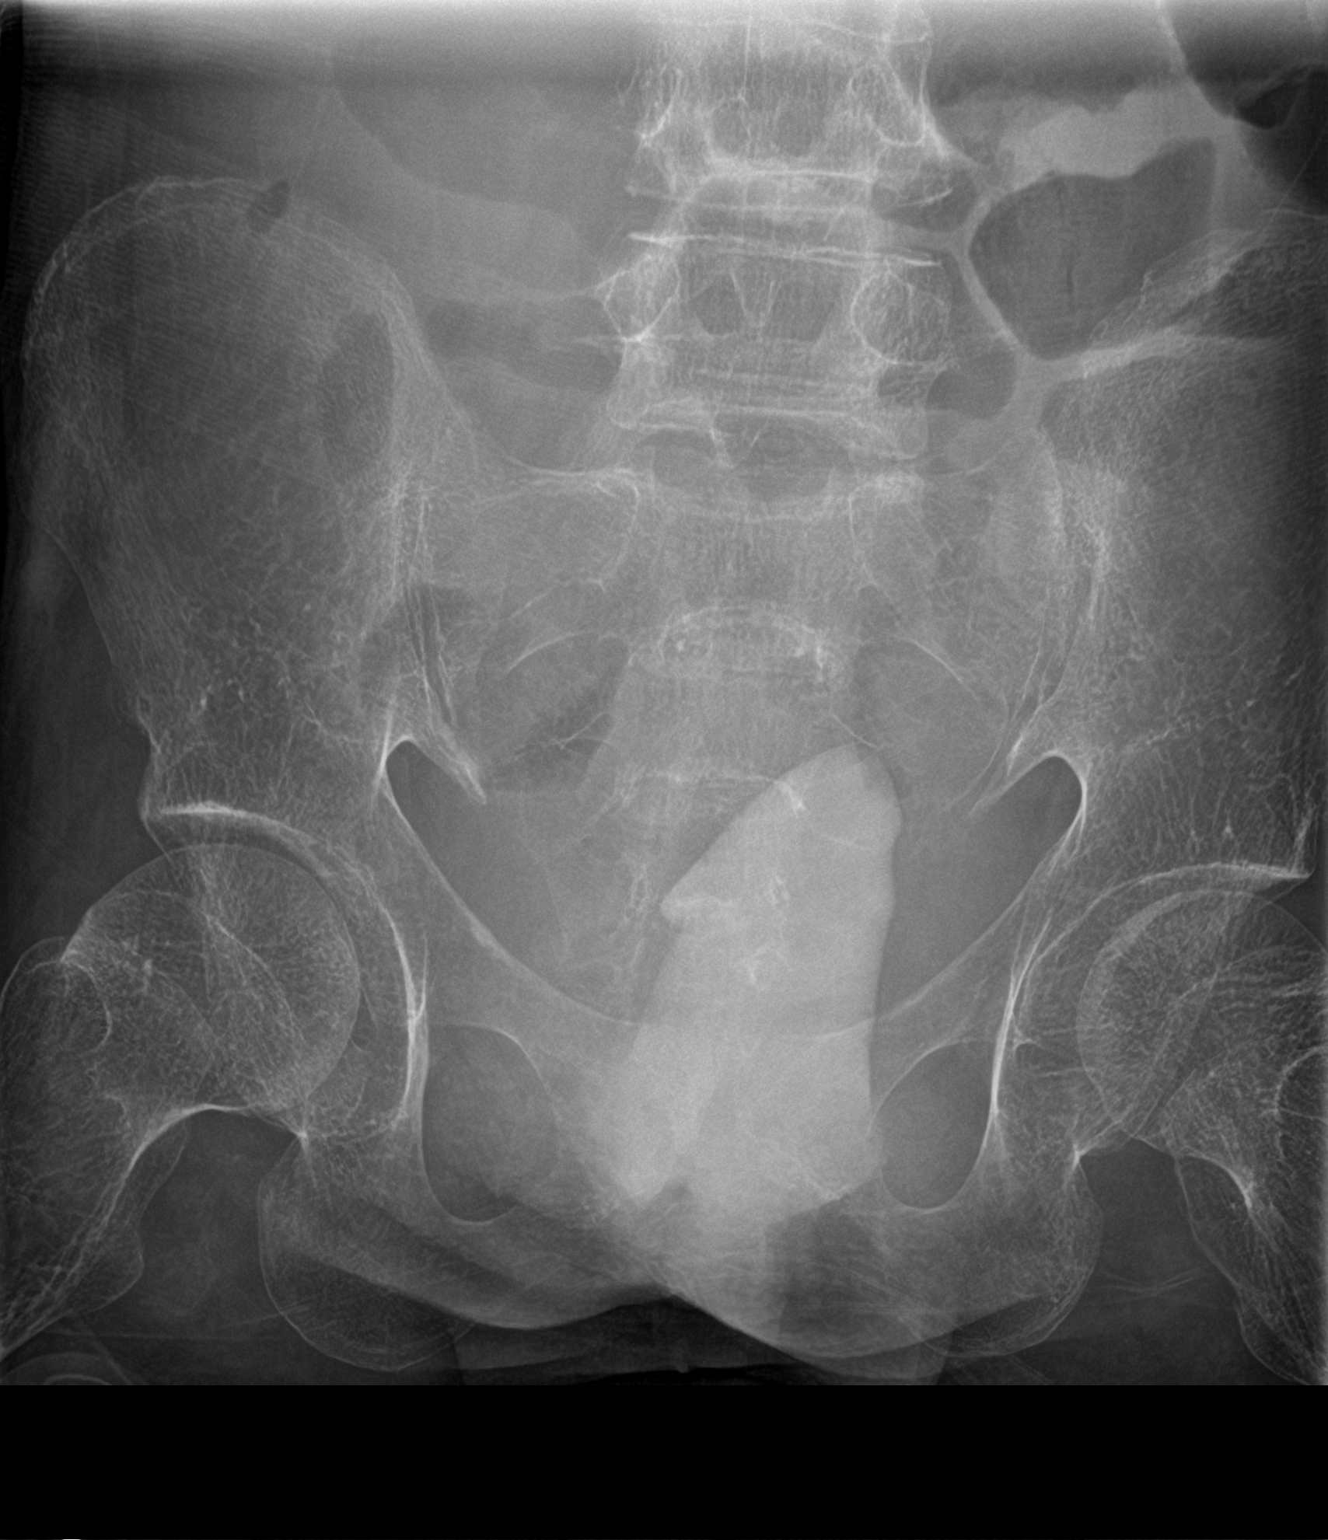

[sacrum lat]
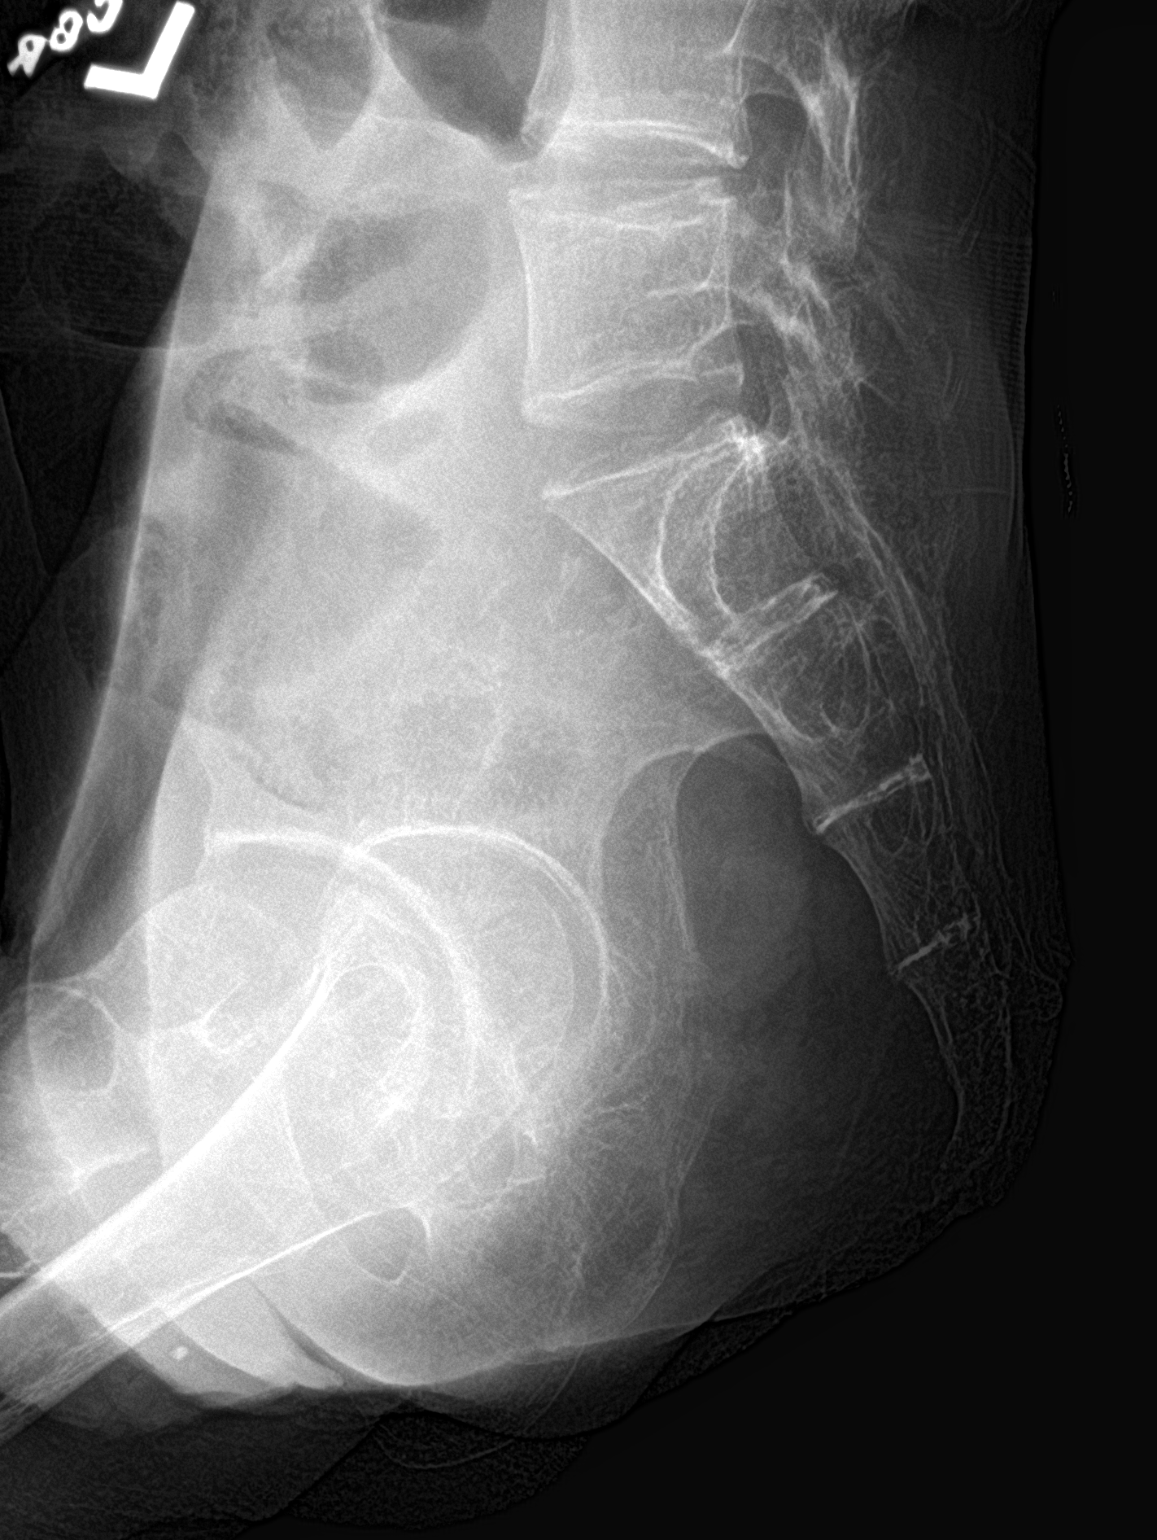

[3 of 3 positions shown; findings below may reference images not displayed]

FINDINGS: No fractures. Mild skin irregularity in the region of the distal
coccyx could represent the nonhealing wound. It is very difficult to
evaluate the underlying bone due to over penetration in this region.
No other focal bony abnormalities.
IMPRESSION: The mild skin irregularity in the region of the distal coccyx could
represent the nonhealing wound. It is very difficult to evaluate the
underlying bone due to over penetration in this region. If there is
concern for osteomyelitis, recommend MRI or CT.

## 2021-07-26 ENCOUNTER — Other Ambulatory Visit: Payer: Self-pay | Admitting: Family Medicine

## 2021-07-27 ENCOUNTER — Other Ambulatory Visit: Payer: Self-pay | Admitting: Family Medicine

## 2021-07-27 DIAGNOSIS — N6320 Unspecified lump in the left breast, unspecified quadrant: Secondary | ICD-10-CM

## 2021-07-30 ENCOUNTER — Other Ambulatory Visit: Payer: Self-pay | Admitting: Family Medicine

## 2021-07-30 DIAGNOSIS — R928 Other abnormal and inconclusive findings on diagnostic imaging of breast: Secondary | ICD-10-CM

## 2021-08-15 ENCOUNTER — Ambulatory Visit
Admission: RE | Admit: 2021-08-15 | Discharge: 2021-08-15 | Disposition: A | Discharge status: Home / Self Care | Payer: BC Managed Care – PPO | Source: Ambulatory Visit | Attending: Family Medicine | Admitting: Family Medicine

## 2021-08-15 ENCOUNTER — Other Ambulatory Visit: Payer: Self-pay

## 2021-08-15 DIAGNOSIS — R928 Other abnormal and inconclusive findings on diagnostic imaging of breast: Secondary | ICD-10-CM | POA: Diagnosis present

## 2021-08-15 DIAGNOSIS — N6320 Unspecified lump in the left breast, unspecified quadrant: Secondary | ICD-10-CM

## 2021-08-20 ENCOUNTER — Other Ambulatory Visit (INDEPENDENT_AMBULATORY_CARE_PROVIDER_SITE_OTHER): Payer: Self-pay | Admitting: Pediatrics

## 2021-09-10 ENCOUNTER — Other Ambulatory Visit (INDEPENDENT_AMBULATORY_CARE_PROVIDER_SITE_OTHER): Payer: Self-pay | Admitting: Pediatrics

## 2021-09-13 ENCOUNTER — Other Ambulatory Visit (HOSPITAL_COMMUNITY): Payer: Self-pay

## 2021-09-14 ENCOUNTER — Encounter (INDEPENDENT_AMBULATORY_CARE_PROVIDER_SITE_OTHER): Payer: Self-pay | Admitting: Pediatrics

## 2021-09-16 ENCOUNTER — Other Ambulatory Visit (INDEPENDENT_AMBULATORY_CARE_PROVIDER_SITE_OTHER): Payer: Self-pay | Admitting: Pediatrics

## 2021-09-25 ENCOUNTER — Other Ambulatory Visit (HOSPITAL_COMMUNITY): Payer: Self-pay

## 2021-09-25 MED ORDER — INSULIN LISPRO (1 UNIT DIAL) 100 UNIT/ML (KWIKPEN)
PEN_INJECTOR | SUBCUTANEOUS | 99 refills | Status: DC
  Filled 2021-09-25: qty 6, 30d supply, fill #0

## 2021-09-25 MED ORDER — UNIFINE PENTIPS 32G X 4 MM MISC
4 refills | Status: DC
  Filled 2021-09-25: qty 100, 25d supply, fill #0

## 2021-09-26 ENCOUNTER — Other Ambulatory Visit (HOSPITAL_COMMUNITY): Payer: Self-pay

## 2021-10-02 ENCOUNTER — Other Ambulatory Visit (INDEPENDENT_AMBULATORY_CARE_PROVIDER_SITE_OTHER): Payer: Self-pay | Admitting: Family

## 2021-11-07 NOTE — Progress Notes (Incomplete)
Patient: [name] MRN: [id number] Sex: male DOB: 04-Jun-1990  Provider: Lorenz Coaster, MD Location of Care: [facility]- Pediatric Complex Care Note type: New patient consultation  History of Present Illness: Referral Source: Joshua Rud, MD History from: patient and prior records Chief Complaint: Complex Care intake   [name] is a 31 y.o. male with history of *** who I am seeing by the request of his PCP for consultation on complex care management. Records were extensively reviewed prior to this appointment and documented as below where appropriate.  Patient was seen prior to this appointment by Elveria Rising for initial intake, and care plan was created (see snapshot).    Patient presents today with {CHL AMB PARENT/GUARDIAN:[id number]}. They report their largest concern is ***   Symptom management:     Care coordination (other providers):  Care management needs:   Equipment needs:   Decision making/Advanced care planning:  Diagnostics:  MRI 06/09/2020: brain Mri with and wo contrast, radiology report: Extensive white matter signal abnormalities with volume loss, nonspecific but indicative of prior injury. This could could be sequela of [id number] associated autoimmune encephalitis given history, versus other metabolic insult.  Left hippocampal sclerosis.  Past Medical History Past Medical History:  Diagnosis Date   Cerebral palsy (HCC)    Diarrhea    History of seizure disorder    TTP (thrombotic thrombocytopenic purpura)     Surgical History Past Surgical History:  Procedure Laterality Date   NISSEN FUNDOPLICATION      Family History family history includes Migraines in his brother.   Social History Social History   Social History Narrative   Not on file    Allergies Allergies  Allergen Reactions   Phenytoin Sodium Extended Rash and Shortness Of Breath   Wound Dressing Adhesive    Albuterol Other (See Comments)     Increased heart rate   Morphine And Related    Phenobarbital    Phenytoin Sodium Extended    Sulfa Antibiotics    Amoxicillin-Pot Clavulanate Diarrhea and Rash   Bacitracin-Polymyxin B Rash   Morphine Other (See Comments) and Rash    Medications Current Outpatient Medications on File Prior to Visit  Medication Sig Dispense Refill   baclofen (LIORESAL) 10 MG tablet Take 5 mg by mouth every morning.     baclofen (LIORESAL) 10 MG tablet Take 10 mg by mouth at bedtime.     bisacodyl (DULCOLAX) 5 MG EC tablet Take 5 mg by mouth daily.     budesonide (PULMICORT) 0.5 MG/2ML nebulizer solution Inhale 2 mLs into the lungs daily.     cefdinir (OMNICEF) 300 MG capsule Take 300 mg by mouth 2 (two) times daily.     cloBAZam (ONFI) 10 MG tablet Take by mouth 2 (two) times daily.     Docusate Sodium 150 MG/15ML syrup Take 10 mLs by mouth daily.     fluticasone (FLONASE) 50 MCG/ACT nasal spray Place 2 sprays into the nose daily.     fluticasone (FLOVENT HFA) 44 MCG/ACT inhaler Inhale into the lungs 2 (two) times daily.     insulin glargine (LANTUS) 100 UNIT/ML Solostar Pen Inject into the skin 2 units under the skin daily. Use up to 30 units per day as MD instructions 15 mL 12   insulin lispro (HUMALOG) 100 UNIT/ML KwikPen Inject up to 20 units daily per MD instructions. 15 mL 99   insulin lispro (HUMALOG) 100 UNIT/ML KwikPen Inject up to 20 units daily per MD instructions. 15 mL  99   Insulin Pen Needle (UNIFINE PENTIPS) 32G X 4 MM MISC Use as directed up to 4 times a day with insulin 200 each 4   lamoTRIgine (LAMICTAL) 200 MG tablet Place 200 mg into feeding tube 2 (two) times daily.     levalbuterol (XOPENEX) 0.63 MG/3ML nebulizer solution Inhale 3 mLs into the lungs every 6 (six) hours as needed for dyspnea.     levofloxacin (LEVAQUIN) 250 MG tablet Take 250 mg by mouth daily.     linaclotide (LINZESS) 72 MCG capsule Take 144 mcg by mouth daily.     mesalamine (PENTASA) 250 MG CR capsule Take 500 mg  by mouth in the morning and at bedtime.     metroNIDAZOLE (FLAGYL) 500 MG tablet Take 500 mg by mouth 2 (two) times daily.     pantoprazole sodium (PROTONIX) 40 mg/20 mL PACK Give 20 mg by tube in the morning and at bedtime.     polyethylene glycol powder (GLYCOLAX/MIRALAX) 17 GM/SCOOP powder Take 17 g by mouth daily.     potassium chloride 20 MEQ/15ML (10%) SOLN Place 15 mLs into feeding tube in the morning and at bedtime.     psyllium (METAMUCIL) 58.6 % packet Take 1 packet by mouth daily. (Patient not taking: Reported on 08/17/2020)     scopolamine (TRANSDERM-SCOP) 1 MG/3DAYS Place 0.5 patches onto the skin every other day.     senna (SENOKOT) 8.6 MG tablet Place 1 tablet into feeding tube at bedtime.     No current facility-administered medications on file prior to visit.   The medication list was reviewed and reconciled. All changes or newly prescribed medications were explained.  A complete medication list was provided to the patient/caregiver.  Physical Exam There were no vitals taken for this visit. Weight for age: Facility age limit for growth percentiles is 20 years.  Length for age: Facility age limit for growth percentiles is 20 years. BMI: There is no height or weight on file to calculate BMI. No results found. Gen: well appearing neuroaffected *** Skin: No rash, No neurocutaneous stigmata. HEENT: Microcephalic, no dysmorphic features, no conjunctival injection, nares patent, mucous membranes moist, oropharynx clear.  Neck: Supple, no meningismus. No focal tenderness. Resp: Clear to auscultation bilaterally CV: Regular rate, normal S1/S2, no murmurs, no rubs Abd: BS present, abdomen soft, non-tender, non-distended. No hepatosplenomegaly or mass Ext: Warm and well-perfused. No deformities, no muscle wasting, ROM full.  Neurological Examination: MS: Awake, alert.  Nonverbal, but interactive, reacts appropriately to conversation.   Cranial Nerves: Pupils were equal and reactive  to light;  No clear visual field defect, no nystagmus; no ptsosis, face symmetric with full strength of facial muscles, hearing grossly intact, palate elevation is symmetric. Motor-Fairly normal tone throughout, moves extremities at least antigravity. No abnormal movements Reflexes- Reflexes 2+ and symmetric in the biceps, triceps, patellar and achilles tendon. Plantar responses flexor bilaterally, no clonus noted Sensation: Responds to touch in all extremities.  Coordination: Does not reach for objects.  Gait: wheelchair dependent, poor head control.     Diagnosis:  Problem List Items Addressed This Visit   None   Assessment and Plan [name] is a 31 y.o. male with history of *** who presents to establish care in the pediatric complex care clinic.  I discussed with family regarding the role of complex care clinic which includes managing complex symptoms, help to coordinate care and provide local resources when possible, and clarifying goals of care and decision making needs.  Patient  will continue to go to subspecialists and PCP for relevant services. A care plan is created for each patient which is in Epic under snapshot, and a physical binder provided to the patient, that can be used for anyone providing care for the patient. I discussed case with all involved parties for coordination of care and recommend patient follow their instructions as below.     Symptom management:     Care coordination (other providers)  Care management needs:   Equipment needs:   Decision making/Advanced care planning:  The CARE PLAN for reviewed and revised to represent the changes above.  This is available in Epic under snapshot, and a physical binder provided to the patient, that can be used for anyone providing care for the patient.   I spent *** minutes on day of service on this patient including review of chart, discussion with patient and family, discussion of screening results,  coordination with other providers and management of orders and paperwork.     No follow-ups on file.  I, Mayra ReelEllie Canty, scribed for and in the presence of Lorenz CoasterStephanie Wolfe, MD at today's visit on 11/12/2021.   Lorenz CoasterStephanie Wolfe MD MPH Neurology,  Neurodevelopment and Neuropalliative care [facility] Child [facility]  [address], [place], [place] [address] Phone: [phone] Fax: [phone]

## 2021-11-09 ENCOUNTER — Other Ambulatory Visit (HOSPITAL_COMMUNITY): Payer: Self-pay

## 2021-11-12 ENCOUNTER — Ambulatory Visit (INDEPENDENT_AMBULATORY_CARE_PROVIDER_SITE_OTHER): Payer: Self-pay | Admitting: Pediatrics

## 2022-01-09 ENCOUNTER — Other Ambulatory Visit (HOSPITAL_COMMUNITY): Payer: Self-pay

## 2022-01-11 ENCOUNTER — Other Ambulatory Visit (HOSPITAL_COMMUNITY): Payer: Self-pay

## 2022-01-11 MED ORDER — INSULIN LISPRO (1 UNIT DIAL) 100 UNIT/ML (KWIKPEN)
PEN_INJECTOR | SUBCUTANEOUS | 99 refills | Status: DC
  Filled 2022-01-11 – 2022-06-13 (×2): qty 6, 30d supply, fill #0

## 2022-01-18 ENCOUNTER — Other Ambulatory Visit (HOSPITAL_COMMUNITY): Payer: Self-pay

## 2022-01-26 ENCOUNTER — Other Ambulatory Visit (HOSPITAL_COMMUNITY): Payer: Self-pay

## 2022-01-26 MED ORDER — INSULIN LISPRO (1 UNIT DIAL) 100 UNIT/ML (KWIKPEN)
PEN_INJECTOR | SUBCUTANEOUS | 99 refills | Status: DC
  Filled 2022-01-26: qty 6, 30d supply, fill #0
  Filled 2022-04-18: qty 6, 30d supply, fill #1
  Filled 2022-06-21: qty 6, 30d supply, fill #2

## 2022-02-15 ENCOUNTER — Encounter: Payer: Self-pay | Admitting: Registered"

## 2022-02-15 ENCOUNTER — Telehealth: Payer: Self-pay | Admitting: Registered"

## 2022-02-15 NOTE — Telephone Encounter (Signed)
Langley Adie, RN with [facility] emailed consult for calorie and free water recommendations for pt. Dietitian recommends the following:   Calorie Recommendations: 1200-1400 kcal/day Protein Recommendations: 35-47 g/day  Fluid Recommendations: 1200-1400 mL/day (1 mL/1 kcal)

## 2022-04-18 ENCOUNTER — Other Ambulatory Visit (HOSPITAL_COMMUNITY): Payer: Self-pay

## 2022-06-13 ENCOUNTER — Other Ambulatory Visit (HOSPITAL_COMMUNITY): Payer: Self-pay

## 2022-06-21 ENCOUNTER — Other Ambulatory Visit (HOSPITAL_COMMUNITY): Payer: Self-pay

## 2022-06-21 ENCOUNTER — Other Ambulatory Visit: Payer: Self-pay

## 2023-02-02 DEATH — deceased
